# Patient Record
Sex: Male | Born: 1980 | Race: Asian | Hispanic: No | Marital: Married | Smoking: Former smoker
Health system: Southern US, Community
[De-identification: ages and names within clinical notes are randomized; demographics above are authoritative.]

## PROBLEM LIST (undated history)

## (undated) DIAGNOSIS — Z8249 Family history of ischemic heart disease and other diseases of the circulatory system: Secondary | ICD-10-CM

## (undated) DIAGNOSIS — R002 Palpitations: Secondary | ICD-10-CM

## (undated) DIAGNOSIS — I10 Essential (primary) hypertension: Secondary | ICD-10-CM

## (undated) DIAGNOSIS — K2950 Unspecified chronic gastritis without bleeding: Secondary | ICD-10-CM

## (undated) DIAGNOSIS — F419 Anxiety disorder, unspecified: Secondary | ICD-10-CM

## (undated) HISTORY — DX: Essential (primary) hypertension: I10

## (undated) HISTORY — DX: Unspecified chronic gastritis without bleeding: K29.50

## (undated) HISTORY — DX: Family history of ischemic heart disease and other diseases of the circulatory system: Z82.49

## (undated) HISTORY — DX: Palpitations: R00.2

---

## 2013-05-06 ENCOUNTER — Encounter (HOSPITAL_COMMUNITY): Payer: Self-pay | Admitting: Emergency Medicine

## 2013-05-06 ENCOUNTER — Emergency Department (HOSPITAL_COMMUNITY): Payer: BC Managed Care – PPO

## 2013-05-06 ENCOUNTER — Emergency Department (HOSPITAL_COMMUNITY)
Admission: EM | Admit: 2013-05-06 | Discharge: 2013-05-07 | Disposition: A | Discharge status: Home / Self Care | Payer: BC Managed Care – PPO | Attending: Emergency Medicine | Admitting: Emergency Medicine

## 2013-05-06 DIAGNOSIS — R9431 Abnormal electrocardiogram [ECG] [EKG]: Secondary | ICD-10-CM | POA: Insufficient documentation

## 2013-05-06 DIAGNOSIS — J3489 Other specified disorders of nose and nasal sinuses: Secondary | ICD-10-CM | POA: Diagnosis not present

## 2013-05-06 DIAGNOSIS — F411 Generalized anxiety disorder: Secondary | ICD-10-CM | POA: Diagnosis not present

## 2013-05-06 DIAGNOSIS — Z79899 Other long term (current) drug therapy: Secondary | ICD-10-CM | POA: Diagnosis not present

## 2013-05-06 DIAGNOSIS — R079 Chest pain, unspecified: Secondary | ICD-10-CM | POA: Insufficient documentation

## 2013-05-06 DIAGNOSIS — F17200 Nicotine dependence, unspecified, uncomplicated: Secondary | ICD-10-CM | POA: Insufficient documentation

## 2013-05-06 DIAGNOSIS — G479 Sleep disorder, unspecified: Secondary | ICD-10-CM | POA: Diagnosis not present

## 2013-05-06 HISTORY — DX: Anxiety disorder, unspecified: F41.9

## 2013-05-06 LAB — BASIC METABOLIC PANEL
BUN: 10 mg/dL (ref 6–23)
Creatinine, Ser: 0.86 mg/dL (ref 0.50–1.35)
GFR calc Af Amer: >90 mL/min (ref >90)
GFR calc non Af Amer: >90 mL/min (ref >90)
Potassium: 3.7 mEq/L (ref 3.5–5.1)

## 2013-05-06 LAB — PRO B NATRIURETIC PEPTIDE: Pro B Natriuretic peptide (BNP): 9.7 pg/mL (ref 0–125)

## 2013-05-06 LAB — POCT I-STAT TROPONIN I

## 2013-05-06 LAB — CBC
HCT: 45.7 % (ref 39.0–52.0)
MCHC: 33.9 g/dL (ref 30.0–36.0)
MCV: 84.8 fL (ref 78.0–100.0)
Platelets: 252 10*3/uL (ref 150–400)
RBC: 5.39 MIL/uL (ref 4.22–5.81)
RDW: 12.4 % (ref 11.5–15.5)
WBC: 8.6 10*3/uL (ref 4.0–10.5)

## 2013-05-06 MED ORDER — LORAZEPAM 1 MG PO TABS
1.0000 mg | ORAL_TABLET | Freq: Once | ORAL | Status: AC
  Administered 2013-05-06: 1 mg via ORAL
  Filled 2013-05-06: qty 1

## 2013-05-06 MED ORDER — ASPIRIN 81 MG PO CHEW
324.0000 mg | CHEWABLE_TABLET | Freq: Once | ORAL | Status: AC
  Administered 2013-05-06: 324 mg via ORAL
  Filled 2013-05-06: qty 4

## 2013-05-06 MED ORDER — OXYMETAZOLINE HCL 0.05 % NA SOLN
1.0000 | Freq: Once | NASAL | Status: AC
  Administered 2013-05-06: 1 via NASAL
  Filled 2013-05-06: qty 30

## 2013-05-06 NOTE — ED Notes (Signed)
Pt refused peripheral IV at this time.

## 2013-05-06 NOTE — ED Notes (Signed)
Pt. reports intermittent mid /left chest pain for 3 days with slight SOB , denies nausea or diaphoresis , no chest pain at triage . Pt. states history of anxiety attack . No history of heart disease.

## 2013-05-06 NOTE — ED Provider Notes (Signed)
CSN: [id number]     Arrival date & time 05/06/13  1937 History   First MD Initiated Contact with Patient 05/06/13 2318     Chief Complaint  Patient presents with  . Chest Pain   (Consider location/radiation/quality/duration/timing/severity/associated sxs/prior Treatment) HPI Hx per PT - CP L sided feels like pins and needles, lasts minutes, comes and goes, has h/o anxiety.  He has had poor sleep last few days, also congested. No fevers, no facial pain or swelling.  He has had a stress test when he lived in [place] - reported normal. He has been working out more frequently at the gym last few weeks.  No recent travel. He is a smoker.  Past Medical History  Diagnosis Date  . Anxiety    History reviewed. No pertinent past surgical history. History reviewed. No pertinent family history. History  Substance Use Topics  . Smoking status: Never Smoker   . Smokeless tobacco: Not on file  . Alcohol Use: Yes    Review of Systems  Constitutional: Negative for fever and chills.  Eyes: Negative for visual disturbance.  Respiratory: Negative for shortness of breath.   Cardiovascular: Positive for chest pain.  Gastrointestinal: Negative for nausea, vomiting and abdominal pain.  Genitourinary: Negative for dysuria.  Musculoskeletal: Negative for back pain, neck pain and neck stiffness.  Skin: Negative for rash.  Neurological: Negative for headaches.  All other systems reviewed and are negative.    Allergies  Review of patient's allergies indicates no known allergies.  Home Medications   Current Outpatient Rx  Name  Route  Sig  Dispense  Refill  . ALPRAZolam (XANAX) 0.25 MG tablet   Oral   Take 0.25 mg by mouth 2 (two) times daily as needed for anxiety.         . pantoprazole (PROTONIX) 40 MG tablet   Oral   Take 40 mg by mouth daily.         Marland Kitchen PARoxetine (PAXIL-CR) 12.5 MG 24 hr tablet   Oral   Take 12.5 mg by mouth 2 (two) times daily.         Marland Kitchen PRESCRIPTION MEDICATION  Oral   Take 1 tablet by mouth every Sunday. *rx from another country, 60000 units of chewable vitamin d once a week*         . PRESCRIPTION MEDICATION   Oral   Take 6.25 mg by mouth at bedtime. *rx from another country, nexipride(levosulpiride) 25mg *         . propranolol (INDERAL) 10 MG tablet   Oral   Take 10 mg by mouth at bedtime.          BP 144/84  Pulse 84  Temp(Src) 98.7 F (37.1 C) (Oral)  Resp 18  Wt 187 lb 6.3 oz (85 kg)  SpO2 99% Physical Exam  Constitutional: He is oriented to person, place, and time. He appears well-developed and well-nourished.  HENT:  Head: Normocephalic and atraumatic.  Eyes: Conjunctivae and EOM are normal. Pupils are equal, round, and reactive to light.  Neck: Trachea normal. Neck supple. No thyromegaly present.  Cardiovascular: Normal rate, regular rhythm, S1 normal, S2 normal and normal pulses.     No systolic murmur is present   No diastolic murmur is present  Pulses:      Radial pulses are 2+ on the right side, and 2+ on the left side.  Pulmonary/Chest: Effort normal and breath sounds normal. He has no wheezes. He has no rhonchi. He has no rales. He  exhibits no tenderness.  Abdominal: Soft. Normal appearance and bowel sounds are normal. There is no tenderness. There is no CVA tenderness and negative Murphy's sign.  Musculoskeletal:  Calves nontender, no cords or erythema, negative Homans sign  Neurological: He is alert and oriented to person, place, and time. He has normal strength. No cranial nerve deficit or sensory deficit. GCS eye subscore is 4. GCS verbal subscore is 5. GCS motor subscore is 6.  Skin: Skin is warm and dry. No rash noted. He is not diaphoretic.  Psychiatric: His speech is normal.  Anxious, otherwise, cooperative and appropriate    ED Course  Procedures (including critical care time) Labs Review Labs Reviewed  CBC  BASIC METABOLIC PANEL  PRO B NATRIURETIC PEPTIDE  POCT I-STAT TROPONIN I   Imaging  Review Dg Chest 2 View  05/06/2013   CLINICAL DATA:  Left chest pain.  History of tobacco use.  EXAM: CHEST - 2 VIEW  COMPARISON:  None  FINDINGS: The heart size and mediastinal contours are within normal limits. There is no evidence of pulmonary edema, consolidation, pneumothorax, nodule or pleural fluid. The visualized skeletal structures are unremarkable.  IMPRESSION: No active disease.   Electronically Signed   By: Irish Lack M.D.   On: 05/06/2013 20:18    EKG Interpretation    Date/Time:  Monday May 06 2013 19:46:02 EST Ventricular Rate:  78 PR Interval:  132 QRS Duration: 92 QT Interval:  380 QTC Calculation: 433 R Axis:   43 Text Interpretation:  Normal sinus rhythm Nonspecific ST abnormality Abnormal ECG Confirmed by Denman Pichardo  MD, Ludwika Rodd [phone]) on 05/06/2013 11:23:48 PM           ASA. Ativan.   Neg serial troponins, no active CP  Plan d/c home, return precautions and outpatient referal. Reliable historian states understanding d/c and f/u instructions.  MDM  Dx: CP, history of anxiety, low risk for ACS  ECG, labs, CXR Medications provided VS and nurses notes reviewed    Sunnie Nielsen, MD 05/07/13 [phone]

## 2013-06-17 ENCOUNTER — Ambulatory Visit (INDEPENDENT_AMBULATORY_CARE_PROVIDER_SITE_OTHER): Payer: BC Managed Care – PPO | Admitting: Cardiology

## 2013-06-17 ENCOUNTER — Encounter: Payer: Self-pay | Admitting: Cardiology

## 2013-06-17 VITALS — BP 116/80 | HR 68 | Ht 69.0 in | Wt 192.8 lb

## 2013-06-17 DIAGNOSIS — Z8249 Family history of ischemic heart disease and other diseases of the circulatory system: Secondary | ICD-10-CM

## 2013-06-17 DIAGNOSIS — I10 Essential (primary) hypertension: Secondary | ICD-10-CM

## 2013-06-17 DIAGNOSIS — R002 Palpitations: Secondary | ICD-10-CM

## 2013-06-17 DIAGNOSIS — R079 Chest pain, unspecified: Secondary | ICD-10-CM

## 2013-06-17 HISTORY — DX: Family history of ischemic heart disease and other diseases of the circulatory system: Z82.49

## 2013-06-17 HISTORY — DX: Palpitations: R00.2

## 2013-06-17 LAB — LIPID PANEL
Cholesterol: 236 mg/dL — ABNORMAL HIGH (ref 0–200)
HDL: 28.9 mg/dL — ABNORMAL LOW (ref >39.00)
Total CHOL/HDL Ratio: 8
Triglycerides: 188 mg/dL — ABNORMAL HIGH (ref 0.0–149.0)
VLDL: 37.6 mg/dL (ref 0.0–40.0)

## 2013-06-17 LAB — COMPREHENSIVE METABOLIC PANEL
ALT: 41 U/L (ref 0–53)
AST: 28 U/L (ref 0–37)
Albumin: 4.5 g/dL (ref 3.5–5.2)
Alkaline Phosphatase: 55 U/L (ref 39–117)
BUN: 11 mg/dL (ref 6–23)
CO2: 26 mEq/L (ref 19–32)
Calcium: 9.3 mg/dL (ref 8.4–10.5)
Chloride: 103 mEq/L (ref 96–112)
Creatinine, Ser: 0.9 mg/dL (ref 0.4–1.5)
GFR: 102.83 mL/min (ref >60.00)
Glucose, Bld: 104 mg/dL — ABNORMAL HIGH (ref 70–99)
Potassium: 3.7 mEq/L (ref 3.5–5.1)
Sodium: 135 mEq/L (ref 135–145)
Total Bilirubin: 0.9 mg/dL (ref 0.3–1.2)
Total Protein: 7.5 g/dL (ref 6.0–8.3)

## 2013-06-17 LAB — LDL CHOLESTEROL, DIRECT: Direct LDL: 178.8 mg/dL

## 2013-06-17 LAB — TSH: TSH: 1.66 u[IU]/mL (ref 0.35–5.50)

## 2013-06-17 NOTE — Patient Instructions (Addendum)
Your physician recommends that you continue on your current medications as directed. Please refer to the Current Medication list given to you today.  Labs Today: Cmet, Lipid, Tsh  Your physician has requested that you have an exercise tolerance test. For further information please visit [email or web address]. Please also follow instruction sheet, as given.  Your physician recommends that you schedule a follow-up appointment in: after gxt

## 2013-06-17 NOTE — Progress Notes (Signed)
Patient ID: Clarence HarpinYogesh S Armstrong, male   DOB: 19-Dec-1980, 33 y.o.   MRN: [id number]     Patient Name: Clarence HarpinYogesh S Armstrong Date of Encounter: 06/17/2013  Primary Care Provider:  No primary provider on file. Primary Cardiologist:  Tobias AlexanderNELSON, Calah Gershman, H   Problem List   Past Medical History  Diagnosis Date  . Anxiety    No past surgical history on file.  Allergies  No Known Allergies  HPI  33 year old male with h/o palpitations, hyperventilation followed by chest pain with radiation to the left arm.  CP L sided feels like pins and needles, lasts minutes, comes and goes. He was seen by a psychiatrist and is being treated for anxiety.  He has had a stress test when he lived in [place] - reported normal. He has been working out more frequently at the gym without chest pain but complains of mild dizziness. He has been smoking since his age 33. His father underwent PCI/stent at age 32. He has been treated for hypertension for the last 5 years. Also h/o antral gastritis after episodic etoh drinking.  Home Medications  Prior to Admission medications   Medication Sig Start Date End Date Taking? Authorizing Provider  ALPRAZolam (XANAX) 0.25 MG tablet Take 0.25 mg by mouth 2 (two) times daily as needed for anxiety.    Historical Provider, MD  pantoprazole (PROTONIX) 40 MG tablet Take 40 mg by mouth daily.    Historical Provider, MD  PARoxetine (PAXIL-CR) 12.5 MG 24 hr tablet Take 12.5 mg by mouth 2 (two) times daily.    Historical Provider, MD  PRESCRIPTION MEDICATION Take 1 tablet by mouth every Sunday. *rx from another country, 60000 units of chewable vitamin d once a week*    Historical Provider, MD  PRESCRIPTION MEDICATION Take 6.25 mg by mouth at bedtime. *rx from another country, nexipride(levosulpiride) 25mg *    Historical Provider, MD  propranolol (INDERAL) 10 MG tablet Take 10 mg by mouth at bedtime.    Historical Provider, MD    Family History  No family history on file.  Social  History  History   Social History  . Marital Status: Married    Spouse Name: N/A    Number of Children: N/A  . Years of Education: N/A   Occupational History  . Not on file.   Social History Main Topics  . Smoking status: Never Smoker   . Smokeless tobacco: Not on file  . Alcohol Use: Yes  . Drug Use: No  . Sexual Activity: Not on file   Other Topics Concern  . Not on file   Social History Narrative  . No narrative on file     Review of Systems, as per HPI, otherwise negative General:  No chills, fever, night sweats or weight changes.  Cardiovascular:  No chest pain, dyspnea on exertion, edema, orthopnea, palpitations, paroxysmal nocturnal dyspnea. Dermatological: No rash, lesions/masses Respiratory: No cough, dyspnea Urologic: No hematuria, dysuria Abdominal:   No nausea, vomiting, diarrhea, bright red blood per rectum, melena, or hematemesis Neurologic:  No visual changes, wkns, changes in mental status. All other systems reviewed and are otherwise negative except as noted above.  Physical Exam  There were no vitals taken for this visit.  General: Pleasant, NAD Psych: Normal affect. Neuro: Alert and oriented X 3. Moves all extremities spontaneously. HEENT: Normal  Neck: Supple without bruits or JVD. Lungs:  Resp regular and unlabored, CTA. Heart: RRR no s3, s4, or murmurs. Abdomen: Soft, non-tender, non-distended, BS +  x 4.  Extremities: No clubbing, cyanosis or edema. DP/PT/Radials 2+ and equal bilaterally.  Labs:  No results found for this basename: CKTOTAL, CKMB, TROPONINI,  in the last 72 hours Lab Results  Component Value Date   WBC 8.6 05/06/2013   HGB 15.5 05/06/2013   HCT 45.7 05/06/2013   MCV 84.8 05/06/2013   PLT 252 05/06/2013   No results found for this basename: NA, K, CL, CO2, BUN, CREATININE, CALCIUM, LABALBU, PROT, BILITOT, ALKPHOS, ALT, AST, GLUCOSE,  in the last 168 hours No results found for this basename: CHOL, HDL, LDLCALC, TRIG    No results found for this basename: DDIMER   No components found with this basename: POCBNP,   Accessory Clinical Findings  echocardiogram  ECG - NSR, normal ECG   Assessment & Plan  33 year old with very atypical chest pain, most probably anxiety that he is being treated for. Normal ECG.  However, 20 years of smoking, h/o hypertension and family h/o CAD. We will order an exercise treadmill stress test, lipid profile, TSH and CMP. His BP is controlled today, we will continue propranolol.  Follow up in 3 weeks.   Lars Masson, MD, [facility] 06/17/2013, 9:34 AM

## 2013-06-19 ENCOUNTER — Ambulatory Visit (HOSPITAL_COMMUNITY)
Admission: RE | Admit: 2013-06-19 | Discharge: 2013-06-19 | Disposition: A | Discharge status: Home / Self Care | Payer: BC Managed Care – PPO | Source: Ambulatory Visit | Attending: Cardiology | Admitting: Cardiology

## 2013-06-19 DIAGNOSIS — R002 Palpitations: Secondary | ICD-10-CM

## 2013-06-19 DIAGNOSIS — R079 Chest pain, unspecified: Secondary | ICD-10-CM

## 2013-06-21 ENCOUNTER — Other Ambulatory Visit: Payer: Self-pay | Admitting: *Deleted

## 2013-06-21 MED ORDER — ATORVASTATIN CALCIUM 10 MG PO TABS
10.0000 mg | ORAL_TABLET | Freq: Every day | ORAL | Status: DC
Start: 2013-06-21 — End: 2013-08-02

## 2013-07-08 ENCOUNTER — Encounter: Payer: Self-pay | Admitting: *Deleted

## 2013-07-15 ENCOUNTER — Ambulatory Visit: Payer: BC Managed Care – PPO | Admitting: Cardiology

## 2013-07-26 ENCOUNTER — Encounter: Payer: Self-pay | Admitting: Cardiology

## 2013-07-26 ENCOUNTER — Ambulatory Visit (INDEPENDENT_AMBULATORY_CARE_PROVIDER_SITE_OTHER): Payer: BC Managed Care – PPO | Admitting: Cardiology

## 2013-07-26 VITALS — BP 131/87 | HR 70 | Ht 69.0 in | Wt 194.0 lb

## 2013-07-26 DIAGNOSIS — R002 Palpitations: Secondary | ICD-10-CM

## 2013-07-26 DIAGNOSIS — I10 Essential (primary) hypertension: Secondary | ICD-10-CM

## 2013-07-26 DIAGNOSIS — R079 Chest pain, unspecified: Secondary | ICD-10-CM

## 2013-07-26 NOTE — Patient Instructions (Addendum)
Your physician recommends that you have lab work TODAY: Lipoprotein A and NMR LipoProfile  Your Physician will contact you if needed to follow-up  Your Physician recommends that you continue on your current medications as directed. Please refer to the Current Medication list given to you today.

## 2013-07-26 NOTE — Progress Notes (Signed)
Patient ID: Clarence Armstrong, male   DOB: 30-Jun-1980, 33 y.o.   MRN: [id number]    Patient Name: Clarence Armstrong Date of Encounter: 07/26/2013  Primary Care Provider:  No primary provider on file. Primary Cardiologist:  Tobias AlexanderNELSON, Earlee Herald, H  Problem List   Past Medical History  Diagnosis Date  . Anxiety   . Antral gastritis     history of  . Unspecified essential hypertension   . Family history of coronary arteriosclerosis 06/17/2013  . Palpitations 06/17/2013   No past surgical history on file.  Allergies  No Known Allergies  HPI  33 year old male with h/o palpitations, hyperventilation followed by chest pain with radiation to the left arm.  CP L sided feels like pins and needles, lasts minutes, comes and goes. He was seen by a psychiatrist and is being treated for anxiety.  He has had a stress test when he lived in [place] - reported normal. He has been working out more frequently at the gym without chest pain but complains of mild dizziness. He has been smoking since his age 33. His father underwent PCI/stent at age 756. He has been treated for hypertension for the last 5 years. Also h/o antral gastritis after episodic etoh drinking.  The patient is coming for followup in one month's he states that he actually feels better. He started to workout in a gym and doesn't have any shortness of breath or chest pain.  Home Medications  Prior to Admission medications   Medication Sig Start Date End Date Taking? Authorizing Provider  ALPRAZolam (XANAX) 0.25 MG tablet Take 0.25 mg by mouth 2 (two) times daily as needed for anxiety.    Historical Provider, MD  pantoprazole (PROTONIX) 40 MG tablet Take 40 mg by mouth daily.    Historical Provider, MD  PARoxetine (PAXIL-CR) 12.5 MG 24 hr tablet Take 12.5 mg by mouth 2 (two) times daily.    Historical Provider, MD  PRESCRIPTION MEDICATION Take 1 tablet by mouth every Sunday. *rx from another country, 60000 units of chewable vitamin d once a week*     Historical Provider, MD  PRESCRIPTION MEDICATION Take 6.25 mg by mouth at bedtime. *rx from another country, nexipride(levosulpiride) 25mg *    Historical Provider, MD  propranolol (INDERAL) 10 MG tablet Take 10 mg by mouth at bedtime.    Historical Provider, MD    Family History  Family History  Problem Relation Age of Onset  . Heart Problems Father 2856    PCI-stent     Social History  History   Social History  . Marital Status: Married    Spouse Name: N/A    Number of Children: N/A  . Years of Education: N/A   Occupational History  . Not on file.   Social History Main Topics  . Smoking status: Current Every Day Smoker -- 11 years    Types: Cigarettes  . Smokeless tobacco: Not on file  . Alcohol Use: Yes  . Drug Use: No  . Sexual Activity: Not on file   Other Topics Concern  . Not on file   Social History Narrative  . No narrative on file     Review of Systems, as per HPI, otherwise negative General:  No chills, fever, night sweats or weight changes.  Cardiovascular:  No chest pain, dyspnea on exertion, edema, orthopnea, palpitations, paroxysmal nocturnal dyspnea. Dermatological: No rash, lesions/masses Respiratory: No cough, dyspnea Urologic: No hematuria, dysuria Abdominal:   No nausea, vomiting, diarrhea, bright red blood  per rectum, melena, or hematemesis Neurologic:  No visual changes, wkns, changes in mental status. All other systems reviewed and are otherwise negative except as noted above.  Physical Exam  Blood pressure 131/87, pulse 70, height 5\' 9"  (1.753 m), weight 194 lb (87.998 kg).  General: Pleasant, NAD Psych: Normal affect. Neuro: Alert and oriented X 3. Moves all extremities spontaneously. HEENT: Normal  Neck: Supple without bruits or JVD. Lungs:  Resp regular and unlabored, CTA. Heart: RRR no s3, s4, or murmurs. Abdomen: Soft, non-tender, non-distended, BS + x 4.  Extremities: No clubbing, cyanosis or edema. DP/PT/Radials 2+ and equal  bilaterally.  Labs:  No results found for this basename: CKTOTAL, CKMB, TROPONINI,  in the last 72 hours Lab Results  Component Value Date   WBC 8.6 05/06/2013   HGB 15.5 05/06/2013   HCT 45.7 05/06/2013   MCV 84.8 05/06/2013   PLT 252 05/06/2013   No results found for this basename: NA, K, CL, CO2, BUN, CREATININE, CALCIUM, LABALBU, PROT, BILITOT, ALKPHOS, ALT, AST, GLUCOSE,  in the last 168 hours Lab Results  Component Value Date   CHOL 236* 06/17/2013   Accessory Clinical Findings  Echocardiogram - none  ECG - NSR, normal ECG   Assessment & Plan  33 year old   1. Atypical chest pain, most probably anxiety that he is being treated for. Normal ECG.  However, 20 years of smoking, h/o hypertension and family h/o CAD. An exercise treadmill stress test showed good exercise capacity and no ischemia.  2. Hyperlipidemia - the patient has elevated triglycerides and LDL and low HDL. He already saw the results and started to exercise on a daily basis and dramatically changed his diet. We will order lipoprotein a and NMR lipid profile and call the patient with the results. Patient prefers not to be treated with statins yet, we will repeat lipid profile after 2 months of his Lifestyle changes.   Lars Masson, MD, [facility] 07/26/2013, 2:33 PM

## 2013-07-27 LAB — LIPOPROTEIN A (LPA): Lipoprotein (a): 18 mg/dL (ref 0–30)

## 2013-07-29 LAB — NMR LIPOPROFILE WITH LIPIDS
Cholesterol, Total: 222 mg/dL — ABNORMAL HIGH (ref <200)
HDL Particle Number: 25 umol/L — ABNORMAL LOW (ref >=30.5)
HDL Size: 8.5 nm — ABNORMAL LOW (ref >=9.2)
HDL-C: 32 mg/dL — ABNORMAL LOW (ref >=40)
LDL (calc): 152 mg/dL — ABNORMAL HIGH (ref <100)
LDL Particle Number: 2289 nmol/L — ABNORMAL HIGH (ref <1000)
LDL Size: 20.2 nm — ABNORMAL LOW (ref >20.5)
LP-IR Score: 81 — ABNORMAL HIGH (ref <=45)
Large HDL-P: <1.3 umol/L — ABNORMAL LOW (ref >=4.8)
Large VLDL-P: 6.7 nmol/L — ABNORMAL HIGH (ref <=2.7)
Small LDL Particle Number: 1373 nmol/L — ABNORMAL HIGH (ref <=527)
Triglycerides: 188 mg/dL — ABNORMAL HIGH (ref <150)
VLDL Size: 52.8 nm — ABNORMAL HIGH (ref <=46.6)

## 2013-07-31 ENCOUNTER — Encounter: Payer: Self-pay | Admitting: Cardiology

## 2013-08-02 ENCOUNTER — Other Ambulatory Visit: Payer: Self-pay

## 2013-08-02 DIAGNOSIS — E785 Hyperlipidemia, unspecified: Secondary | ICD-10-CM

## 2013-08-02 MED ORDER — PANTOPRAZOLE SODIUM 40 MG PO TBEC: 40.0000 mg | DELAYED_RELEASE_TABLET | Freq: Every day | ORAL | Status: DC

## 2013-08-02 MED ORDER — ATORVASTATIN CALCIUM 40 MG PO TABS: 40.0000 mg | ORAL_TABLET | Freq: Every day | ORAL | Status: DC

## 2013-08-28 ENCOUNTER — Encounter: Payer: Self-pay | Admitting: Cardiology

## 2013-09-06 ENCOUNTER — Encounter: Payer: Self-pay | Admitting: Cardiology

## 2013-09-16 ENCOUNTER — Encounter (HOSPITAL_COMMUNITY): Payer: Self-pay | Admitting: Emergency Medicine

## 2013-09-16 ENCOUNTER — Emergency Department (HOSPITAL_COMMUNITY)
Admission: EM | Admit: 2013-09-16 | Discharge: 2013-09-16 | Disposition: A | Discharge status: Home / Self Care | Payer: BC Managed Care – PPO | Source: Home / Self Care

## 2013-09-16 DIAGNOSIS — IMO0002 Reserved for concepts with insufficient information to code with codable children: Secondary | ICD-10-CM

## 2013-09-16 DIAGNOSIS — M545 Low back pain, unspecified: Secondary | ICD-10-CM

## 2013-09-16 DIAGNOSIS — X58XXXA Exposure to other specified factors, initial encounter: Secondary | ICD-10-CM

## 2013-09-16 DIAGNOSIS — F450 Somatization disorder: Secondary | ICD-10-CM

## 2013-09-16 DIAGNOSIS — F411 Generalized anxiety disorder: Secondary | ICD-10-CM

## 2013-09-16 DIAGNOSIS — S46919A Strain of unspecified muscle, fascia and tendon at shoulder and upper arm level, unspecified arm, initial encounter: Secondary | ICD-10-CM

## 2013-09-16 NOTE — ED Notes (Signed)
C/o back pain and medication for cholesterol States lower back pain radiates to left side of neck Denies any injury States he does have a High LDL and would like medication States statins was making him feel bad

## 2013-09-16 NOTE — ED Provider Notes (Signed)
CSN: [id number]     Arrival date & time 09/16/13  1052 History   First MD Initiated Contact with Patient 09/16/13 1242     Chief Complaint  Patient presents with  . Back Pain  . Hyperlipidemia   (Consider location/radiation/quality/duration/timing/severity/associated sxs/prior Treatment) HPI Comments: 33 year old [place] male presents with a complaint of left lower back discomfort and left upper back/shoulder discomfort. The discomfort is elicited or exacerbated by twisting of the torso in certain movements of the arm and shoulder. Denies any known injury. He had been exercising awakes but not in recent weeks. Patient has history of generalized anxiety disorder and somatization. Recently he was seen by cardiologist and a normal exercise stress test. He voices several minor complaints that seem unrelated. Last night when he was experiencing left low back pain he became frightened that it was his heart and was having a heart attack. He was shaking and shivering with fear. He states that he has been having chest pains intermittently for several months. He is also concerned about a small bump in the wrist which is either a carpal bone or small ganglion. His second concern is that of rather excessive worry over elevated cholesterol levels. This was discussed with his cardiologist and a recent visit at the patient declined statins due to a previous adverse effect of myalgias. He is concerned that his cholesterol is elevated and if it is not reduced soon  he will have a heart attack.   Past Medical History  Diagnosis Date  . Anxiety   . Antral gastritis     history of  . Unspecified essential hypertension   . Family history of coronary arteriosclerosis 06/17/2013  . Palpitations 06/17/2013   History reviewed. No pertinent past surgical history. Family History  Problem Relation Age of Onset  . Heart Problems Father [id number]    PCI-stent    History  Substance Use Topics  . Smoking status: Current Every Day  Smoker -- 11 years    Types: Cigarettes  . Smokeless tobacco: Not on file  . Alcohol Use: Yes    Review of Systems  Constitutional: Negative for fever, activity change and fatigue.  HENT: Negative.   Respiratory: Negative for cough and stridor.   Cardiovascular: Positive for chest pain. Negative for leg swelling.       Not today.  Gastrointestinal: Negative.   Genitourinary: Negative.   Musculoskeletal: Positive for back pain. Negative for joint swelling, neck pain and neck stiffness.  Skin: Negative.   Neurological: Positive for tremors, light-headedness and numbness. Negative for dizziness, seizures, syncope, facial asymmetry, speech difficulty, weakness and headaches.  Psychiatric/Behavioral: Positive for sleep disturbance and agitation. Negative for suicidal ideas, confusion, self-injury, dysphoric mood and decreased concentration. The patient is nervous/anxious.     Allergies  Review of patient's allergies indicates no known allergies.  Home Medications   Current Outpatient Rx  Name  Route  Sig  Dispense  Refill  . ALPRAZolam (XANAX) 0.25 MG tablet   Oral   Take 0.25 mg by mouth 2 (two) times daily as needed for anxiety.         Marland Kitchen. atorvastatin (LIPITOR) 40 MG tablet   Oral   Take 1 tablet (40 mg total) by mouth at bedtime.   30 tablet   6     Increase in dose   . pantoprazole (PROTONIX) 40 MG tablet   Oral   Take 1 tablet (40 mg total) by mouth daily.   30 tablet   6  Increase in dose   . PARoxetine (PAXIL-CR) 12.5 MG 24 hr tablet   Oral   Take 12.5 mg by mouth 2 (two) times daily.         Marland Kitchen PRESCRIPTION MEDICATION   Oral   Take 1 tablet by mouth every Sunday. *rx from another country, 60000 units of chewable vitamin d once a week*         . PRESCRIPTION MEDICATION   Oral   Take 6.25 mg by mouth at bedtime. *rx from another country, nexipride(levosulpiride) 25mg *         . propranolol (INDERAL) 10 MG tablet   Oral   Take 10 mg by mouth at  bedtime.          BP 150/87  Pulse 74  Temp(Src) 98.2 F (36.8 C) (Oral)  Resp 14  SpO2 99% Physical Exam  Nursing note and vitals reviewed. Constitutional: He appears well-developed and well-nourished. No distress.  HENT:  Head: Normocephalic.  Eyes: Conjunctivae and EOM are normal. Pupils are equal, round, and reactive to light.  Neck: Normal range of motion. Neck supple.  Cardiovascular: Normal rate, regular rhythm and normal heart sounds.   Pulmonary/Chest: Effort normal and breath sounds normal. No respiratory distress. He has no wheezes. He has no rales. He exhibits no tenderness.  Musculoskeletal: Normal range of motion. He exhibits tenderness. He exhibits no edema.  No tenderness to the left paralumbar muscles however it is reproduced with the patient rotation of his torso. There is mild tenderness to the peri-scapular muscles and infrascapular muscle. This is exacerbated by posterior abduction of the shoulder as well as placing his left arm behind his back. There is no swelling, deformity or discoloration. Distal neurovascular motor sensory is intact. Full range of motion of the left shoulder and arm.  Lymphadenopathy:    He has no cervical adenopathy.  Neurological: He is alert. He exhibits normal muscle tone.  Skin: Skin is warm and dry. No rash noted. No erythema.  Psychiatric: His speech is normal. His mood appears anxious. His affect is not angry and not blunt. He is not agitated, not slowed, not withdrawn and not combative. Cognition and memory are normal. He does not exhibit a depressed mood.    ED Course  Procedures (including critical care time) Labs Review Labs Reviewed - No data to display Imaging Review No results found.   MDM   1. Left low back pain   2. Muscle strain of scapular region   3. GAD (generalized anxiety disorder)   4. Somatization disorder     Heat and stretches to shoulder and back NSAIDS Reassurance. Cont with walking daily, and  eating a low fat/cholesterol diet. F/u with cards for lipid management     Hayden Rasmussen, NP 09/16/13 1339

## 2013-09-16 NOTE — ED Provider Notes (Signed)
Medical screening examination/treatment/procedure(s) were performed by non-physician practitioner and as supervising physician I was immediately available for consultation/collaboration.  Basil Buffin, M.D.  Adan Beal C Cray Monnin, MD 09/16/13 1611 

## 2013-09-16 NOTE — Discharge Instructions (Signed)
Back Pain, Adult Low back pain is very common. About 1 in 5 people have back pain.The cause of low back pain is rarely dangerous. The pain often gets better over time.About half of people with a sudden onset of back pain feel better in just 2 weeks. About 8 in 10 people feel better by 6 weeks.  CAUSES Some common causes of back pain include:  Strain of the muscles or ligaments supporting the spine.  Wear and tear (degeneration) of the spinal discs.  Arthritis.  Direct injury to the back. DIAGNOSIS Most of the time, the direct cause of low back pain is not known.However, back pain can be treated effectively even when the exact cause of the pain is unknown.Answering your caregiver's questions about your overall health and symptoms is one of the most accurate ways to make sure the cause of your pain is not dangerous. If your caregiver needs more information, he or she may order lab work or imaging tests (X-rays or MRIs).However, even if imaging tests show changes in your back, this usually does not require surgery. HOME CARE INSTRUCTIONS For many people, back pain returns.Since low back pain is rarely dangerous, it is often a condition that people can learn to [facility] their own.   Remain active. It is stressful on the back to sit or stand in one place. Do not sit, drive, or stand in one place for more than 30 minutes at a time. Take short walks on level surfaces as soon as pain allows.Try to increase the length of time you walk each day.  Do not stay in bed.Resting more than 1 or 2 days can delay your recovery.  Do not avoid exercise or work.Your body is made to move.It is not dangerous to be active, even though your back may hurt.Your back will likely heal faster if you return to being active before your pain is gone.  Pay attention to your body when you bend and lift. Many people have less discomfortwhen lifting if they bend their knees, keep the load close to their bodies,and  avoid twisting. Often, the most comfortable positions are those that put less stress on your recovering back.  Find a comfortable position to sleep. Use a firm mattress and lie on your side with your knees slightly bent. If you lie on your back, put a pillow under your knees.  Only take over-the-counter or prescription medicines as directed by your caregiver. Over-the-counter medicines to reduce pain and inflammation are often the most helpful.Your caregiver may prescribe muscle relaxant drugs.These medicines help dull your pain so you can more quickly return to your normal activities and healthy exercise.  Put ice on the injured area.  Put ice in a plastic bag.  Place a towel between your skin and the bag.  Leave the ice on for 15-20 minutes, 03-04 times a day for the first 2 to 3 days. After that, ice and heat may be alternated to reduce pain and spasms.  Ask your caregiver about trying back exercises and gentle massage. This may be of some benefit.  Avoid feeling anxious or stressed.Stress increases muscle tension and can worsen back pain.It is important to recognize when you are anxious or stressed and learn ways to manage it.Exercise is a great option. SEEK MEDICAL CARE IF:  You have pain that is not relieved with rest or medicine.  You have pain that does not improve in 1 week.  You have new symptoms.  You are generally not feeling well. SEEK  IMMEDIATE MEDICAL CARE IF:   You have pain that radiates from your back into your legs.  You develop new bowel or bladder control problems.  You have unusual weakness or numbness in your arms or legs.  You develop nausea or vomiting.  You develop abdominal pain.  You feel faint. Document Released: 05/30/2005 Document Revised: 11/29/2011 Document Reviewed: 10/18/2010 [facility] Patient Information 2014 [place], [place].  Muscle Strain A muscle strain is an injury that occurs when a muscle is stretched beyond its normal length.  Usually a small number of muscle fibers are torn when this happens. Muscle strain is rated in degrees. First-degree strains have the least amount of muscle fiber tearing and pain. Second-degree and third-degree strains have increasingly more tearing and pain.  Usually, recovery from muscle strain takes 1 2 weeks. Complete healing takes 5 6 weeks.  CAUSES  Muscle strain happens when a sudden, violent force placed on a muscle stretches it too far. This may occur with lifting, sports, or a fall.  RISK FACTORS Muscle strain is especially common in athletes.  SIGNS AND SYMPTOMS At the site of the muscle strain, there may be:  Pain.  Bruising.  Swelling.  Difficulty using the muscle due to pain or lack of normal function. DIAGNOSIS  Your health care provider will perform a physical exam and ask about your medical history. TREATMENT  Often, the best treatment for a muscle strain is resting, icing, and applying cold compresses to the injured area.  HOME CARE INSTRUCTIONS   Use the PRICE method of treatment to promote muscle healing during the first 2 3 days after your injury. The PRICE method involves:  Protecting the muscle from being injured again.  Restricting your activity and resting the injured body part.  Icing your injury. To do this, put ice in a plastic bag. Place a towel between your skin and the bag. Then, apply the ice and leave it on from 15 20 minutes each hour. After the third day, switch to moist heat packs.  Apply compression to the injured area with a splint or elastic bandage. Be careful not to wrap it too tightly. This may interfere with blood circulation or increase swelling.  Elevate the injured body part above the level of your heart as often as you can.  Only take over-the-counter or prescription medicines for pain, discomfort, or fever as directed by your health care provider.  Warming up prior to exercise helps to prevent future muscle strains. SEEK MEDICAL  CARE IF:   You have increasing pain or swelling in the injured area.  You have numbness, tingling, or a significant loss of strength in the injured area. MAKE SURE YOU:   Understand these instructions.  Will watch your condition.  Will get help right away if you are not doing well or get worse. Document Released: 05/30/2005 Document Revised: 03/20/2013 Document Reviewed: 12/27/2012 [facility] Patient Information 2014 [place], [place].  Somatization Disorder Somatization disorder is a type of somatoform disorder. It occurs when a person feels pain and other symptoms that cannot be explained by medical findings. It is diagnosed after many medical tests and exams. The pain, physical symptoms, or emotional symptoms are real. They are not faked or created. Often, people with somatization disorder are worried about their symptoms. Along with a medical caregiver, a mental health caregiver can help to diagnose somatization disorder and teach coping skills. The disorder can last for several years and may be intermittent. CAUSES  It is unclear what causes somatization disorder.  It may be related to:  Abnormal nerve impulses.  Psychological stress.  Family history.  Chronic pain.  History of sexual or physical abuse. The disorder usually begins before age 32. It occurs more often in women than men. SYMPTOMS There are many symptoms that can accompany somatization disorder. Usually, several mild to moderate symptoms appear. A person with somatization disorder seeks many medical visits to try to determine the cause of symptoms. DIAGNOSIS Diagnostic testing will vary depending on the specific symptoms involved. A person with this disorder will often have many medical tests and exams to rule out serious physical health problems. Evaluation may include:  Physical exam.  Screening health questionnaire.  Medical tests, such as blood tests, urine tests, X-rays, or other imaging  tests. TREATMENT There is no cure for somatization disorder, but the symptoms can be managed and sometimes prevented. Treatment aims at teaching coping skills, reducing stress, and preventing new episodes. Once somatization disorder is diagnosed, treatment may include:  Regular monitoring and consultation with a dedicated caregiver for evaluation and coping skills.  Regular monitoring and therapy with a mental health provider to increase understanding of triggers and coping skills. Therapies may include:  Talk therapy.  Cognitive behavioral therapy.  Antidepressant medicines. It can be helpful for a primary caregiver and mental health provider to work together to develop a treatment plan. HOME CARE INSTRUCTIONS  Follow up with your primary caregiver or mental health provider as directed.  Take medicines as directed.  Try to reduce stress.  Exercise regularly. SEEK MEDICAL CARE IF:  New symptoms appear.  Pain or symptoms do not go away or become severe. SEEK IMMEDIATE MEDICAL CARE IF: You feel that your life or health are in danger. MAKE SURE YOU:  Understand these instructions.  Will watch your condition.  Will get help right away if you are not doing well or get worse. Document Released: 07/02/2010 Document Revised: 08/22/2011 Document Reviewed: 07/02/2010 [facility] Patient Information 2014 [place].  Generalized Anxiety Disorder Generalized anxiety disorder (GAD) is a mental disorder. It interferes with life functions, including relationships, work, and school. GAD is different from normal anxiety, which everyone experiences at some point in their lives in response to specific life events and activities. Normal anxiety actually helps us prepare for and get through these life events and activities. Normal anxiety goes away after the event or activity is over.  GAD causes anxiety that is not necessarily related to specific events or activities. It also causes excess  anxiety in proportion to specific events or activities. The anxiety associated with GAD is also difficult to control. GAD can vary from mild to severe. People with severe GAD can have intense waves of anxiety with physical symptoms (panic attacks).  SYMPTOMS The anxiety and worry associated with GAD are difficult to control. This anxiety and worry are related to many life events and activities and also occur more days than not for 6 months or longer. People with GAD also have three or more of the following symptoms (one or more in children):  Restlessness.   Fatigue.  Difficulty concentrating.   Irritability.  Muscle tension.  Difficulty sleeping or unsatisfying sleep. DIAGNOSIS GAD is diagnosed through an assessment by your caregiver. Your caregiver will ask you questions aboutyour mood,physical symptoms, and events in your life. Your caregiver may ask you about your medical history and use of alcohol or drugs, including prescription medications. Your caregiver may also do a physical exam and blood tests. Certain medical conditions and the use  of certain substances can cause symptoms similar to those associated with GAD. Your caregiver may refer you to a mental health specialist for further evaluation. TREATMENT The following therapies are usually used to treat GAD:   Medication Antidepressant medication usually is prescribed for long-term daily control. Antianxiety medications may be added in severe cases, especially when panic attacks occur.   Talk therapy (psychotherapy) Certain types of talk therapy can be helpful in treating GAD by providing support, education, and guidance. A form of talk therapy called cognitive behavioral therapy can teach you healthy ways to think about and react to daily life events and activities.  Stress managementtechniques These include yoga, meditation, and exercise and can be very helpful when they are practiced regularly. A mental health specialist  can help determine which treatment is best for you. Some people see improvement with one therapy. However, other people require a combination of therapies. Document Released: 09/24/2012 Document Reviewed: 09/24/2012 [facility] Patient Information 2014 [place], [place].

## 2013-09-19 ENCOUNTER — Ambulatory Visit (INDEPENDENT_AMBULATORY_CARE_PROVIDER_SITE_OTHER): Payer: BC Managed Care – PPO | Admitting: Pharmacist

## 2013-09-19 ENCOUNTER — Other Ambulatory Visit (INDEPENDENT_AMBULATORY_CARE_PROVIDER_SITE_OTHER): Payer: BC Managed Care – PPO

## 2013-09-19 VITALS — Wt 195.0 lb

## 2013-09-19 DIAGNOSIS — Z79899 Other long term (current) drug therapy: Secondary | ICD-10-CM

## 2013-09-19 DIAGNOSIS — E785 Hyperlipidemia, unspecified: Secondary | ICD-10-CM

## 2013-09-19 LAB — COMPREHENSIVE METABOLIC PANEL
ALT: 44 U/L (ref 0–53)
AST: 30 U/L (ref 0–37)
Albumin: 4 g/dL (ref 3.5–5.2)
Alkaline Phosphatase: 67 U/L (ref 39–117)
BUN: 15 mg/dL (ref 6–23)
CO2: 28 mEq/L (ref 19–32)
Calcium: 9.1 mg/dL (ref 8.4–10.5)
Chloride: 106 mEq/L (ref 96–112)
Creatinine, Ser: 0.8 mg/dL (ref 0.4–1.5)
GFR: 126.38 mL/min (ref >60.00)
Glucose, Bld: 93 mg/dL (ref 70–99)
Potassium: 4 mEq/L (ref 3.5–5.1)
Sodium: 138 mEq/L (ref 135–145)
Total Bilirubin: 0.6 mg/dL (ref 0.3–1.2)
Total Protein: 6.6 g/dL (ref 6.0–8.3)

## 2013-09-19 NOTE — Progress Notes (Signed)
Patient is a pleasant 33 y.o. Male of asian indian descent referred to lipid clinic by Dr. Delton See.  He who moved to [place] from [place] in 02/2012 by himself, then his wife moved in 2014.  Patient stated he ate horribly for the first year he lived in the [place], eating junk food, pizza, fast food a lot.  Now that his wife is here he is eating better.  He saw his blood work results from 07/2013 and given his elevated LDL and LDL-P number he got "scared into eating better".  He is now eating a healthy low fat diet, and avoiding fatty, greasy, or fast food.  He has also started walking 4 miles per day.  He tried lipitor 40 mg in 07/2013, however had to stop it in 08/2013 due to muscle and joint aches.  Five days after stopping lipitor, his aches resolved.  He does have an ongoing issue with anxiety which he takes medication for.  He has tried to cut down on his cigarette smoking since seeing blood work, but can't stop completely yet, as this is a crutch for his stress at work.  Smoking 6-7 cigarettes per day currently.  He use to be a heavy drinker (alcohol) as well, however hasn't had any alcohol over past 6 months.  Of note, patient may be moving at the end of May so wants to do everything possible to get cholesterol improved and on medication if needed before he moves.  RF:  Family h/o CAD (father had PCI at 74 y.o.), HTN, low HDL- LDL goal at least < 130, prefer < 100;  LDL-P number goal at least < 1300 Meds:  Not on lipid lowering therapy currently. Intolerant:  Lipitor 40 mg qd (muscle/joint aches)  Family history:  Father had PCI at 75 y.o.  Has a younger brother with no health problems. Social history:  Smoked since he was 33 years old.  Has cut back to 6-7 cigarettes per day, but given stress of his job, will be hard to quit altogether.  Stopped drinking alcohol 6 months ago. Diet:  Excellent now.  Breakfast is cereal and almonds.  Lunch he comes home for now - vegetable and lentil based pita  wrap.  Uses olive oil and fresh vegetables for all meals.  Dinner is typically vegetables as well.  He sometimes eats cereal at night if he gets home late.  Drinks 1 glass of green tea during the day.  Doesn't drink soda anymore (use to drink multiple sodas per day up until a few months ago).  Desserts - eats a small [place] dessert twice per week (use to do this 7 times per week).   Exercise:  Now walking 4 miles per day (takes him 1 hour).  This started 2 months ago.  Labs: 07/2013:  Lp(a) 18, LDL-P number 2289, LDL 152, HDL 32, TG 188, TC 222, TSH normal, LFTs normal, renal fxn normal - Not on lipid lowering meds.  Current Outpatient Prescriptions  Medication Sig Dispense Refill  . ALPRAZolam (XANAX) 0.25 MG tablet Take 0.25 mg by mouth 2 (two) times daily as needed for anxiety.      Marland Kitchen atorvastatin (LIPITOR) 40 MG tablet Take 1 tablet (40 mg total) by mouth at bedtime.  30 tablet  6  . pantoprazole (PROTONIX) 40 MG tablet Take 1 tablet (40 mg total) by mouth daily.  30 tablet  6  . PARoxetine (PAXIL-CR) 12.5 MG 24 hr tablet Take 12.5 mg by mouth 2 (two)  times daily.      Marland Kitchen. PRESCRIPTION MEDICATION Take 1 tablet by mouth every Sunday. *rx from another country, 60000 units of chewable vitamin d once a week*      . PRESCRIPTION MEDICATION Take 6.25 mg by mouth at bedtime. *rx from another country, nexipride(levosulpiride) 25mg *      . propranolol (INDERAL) 10 MG tablet Take 10 mg by mouth at bedtime.       No current facility-administered medications for this visit.   Allergies  Allergen Reactions  . Lipitor [Atorvastatin]     Joint and muscle aches on lipitor 40 mg qd   Family History  Problem Relation Age of Onset  . Heart Problems Father [id number]    PCI-stent

## 2013-09-19 NOTE — Assessment & Plan Note (Addendum)
Given patients excellent improvement in lifestyle, including reducing fat, cholesterol, sugar, and calories from diet, and him now walking 4 miles per day, will check an NMR and CMET today.  Patient understands he will likely still need lipid lowering therapy given his family history and current LDL-P number > 2000 (goal at least < 1300), but we will see what our cholesterol is down to before initiating therapy.  We discussed the use of Crestor at low dose today (~ 5 mg daily) given asian indian descent.  If this fails as well, would consider Livalo (~ 2 mg qd).  Will call patinet at [phone] with results and plan once I get labs back.  09/19/13 labs: LDL-P 2005 (down from 2289), LDL 111 (down from 152), TG 237, HDL 29, TG 187, glucose 93, LFTs normal, kidney function normal Patient advised to continue current diet and exercise regimen, and to start Crestor 5 mg once daily.  If muscle aches develop, he should start taking 1/2 tablet daily of this.  If muscle aches continue, he agrees to call, and we can try to use Livalo samples at low dose.  Patient may be moving the end of May, so he wants to recheck cholesterol in 6 weeks to make sure he has results before he leaves.  Lab on 5/15 and see me on 10/29/13 to review.

## 2013-09-20 LAB — NMR LIPOPROFILE WITH LIPIDS
Cholesterol, Total: 187 mg/dL (ref <200)
HDL Particle Number: 21.5 umol/L — ABNORMAL LOW (ref >=30.5)
HDL Size: 8.7 nm — ABNORMAL LOW (ref >=9.2)
HDL-C: 29 mg/dL — ABNORMAL LOW (ref >=40)
LDL (calc): 111 mg/dL — ABNORMAL HIGH (ref <100)
LDL Particle Number: 2005 nmol/L — ABNORMAL HIGH (ref <1000)
LDL Size: 20.3 nm — ABNORMAL LOW (ref >20.5)
LP-IR Score: 74 — ABNORMAL HIGH (ref <=45)
Large HDL-P: <1.3 umol/L — ABNORMAL LOW (ref >=4.8)
Large VLDL-P: 5.3 nmol/L — ABNORMAL HIGH (ref <=2.7)
Small LDL Particle Number: 1268 nmol/L — ABNORMAL HIGH (ref <=527)
Triglycerides: 237 mg/dL — ABNORMAL HIGH (ref <150)
VLDL Size: 51.6 nm — ABNORMAL HIGH (ref <=46.6)

## 2013-09-20 MED ORDER — ROSUVASTATIN CALCIUM 5 MG PO TABS: 5.0000 mg | ORAL_TABLET | Freq: Every day | ORAL | Status: DC

## 2013-09-20 NOTE — Patient Instructions (Signed)
Plan: 1.  Start Crestor 5 mg once daily.  If muscle aches start to occur, then start taking 1/2 tablet daily instead.  If muscle aches don't go away on 1/2 tablet, then stop the Crestor, and call Riki RuskJeremy at [phone]. 2.  Continue current diet and exercise regimen. 3.  Recheck blood work 10/25/13 (fasting - show up anytime after 7:30 am).  See Riki RuskJeremy 10/29/13 at 8:30 am to review.

## 2013-09-29 ENCOUNTER — Ambulatory Visit (HOSPITAL_COMMUNITY): Payer: BC Managed Care – PPO | Attending: Emergency Medicine

## 2013-09-29 ENCOUNTER — Encounter (HOSPITAL_COMMUNITY): Payer: Self-pay | Admitting: Emergency Medicine

## 2013-09-29 ENCOUNTER — Emergency Department (HOSPITAL_COMMUNITY)
Admission: EM | Admit: 2013-09-29 | Discharge: 2013-09-29 | Disposition: A | Discharge status: Home / Self Care | Payer: BC Managed Care – PPO | Source: Home / Self Care | Attending: Emergency Medicine | Admitting: Emergency Medicine

## 2013-09-29 DIAGNOSIS — R079 Chest pain, unspecified: Secondary | ICD-10-CM

## 2013-09-29 MED ORDER — GI COCKTAIL ~~LOC~~
30.0000 mL | Freq: Once | ORAL | Status: AC
  Administered 2013-09-29: 30 mL via ORAL

## 2013-09-29 MED ORDER — GI COCKTAIL ~~LOC~~
ORAL | Status: AC
  Filled 2013-09-29: qty 30

## 2013-09-29 NOTE — ED Notes (Signed)
Checked patient for right sided chest pain; pain on inspiration. Patient states pain started last night. History of hypertension and anxiety. Patient denies shortness of breath, palpitations, dizziness and fatigue. Patient's vital signs were hemodynamically stable. Placed patient in lobby to wait for room.

## 2013-09-29 NOTE — ED Provider Notes (Signed)
Chief Complaint   Chief Complaint  Patient presents with  . Chest Pain    History of Present Illness    Clarence Armstrong is a 33 year old male who has had a two-day history of pain in the right chest in the submammary area. This is described as a sticking feeling as if he's being stuck by a pin. Episodes come and go and last about 30 seconds at a time. There no obvious precipitating or relieving factors. He's had no fever, chills, coughing, wheezing, or shortness of breath. He denies any nausea, diaphoresis, or syncope. He has felt nervous and anxious. He's also had positional vertigo. He's been treated for elevated cholesterol recently. He was on Lipitor, but this caused side effects, so recently switched to Crestor. He's concerned that this might be causing side effects as well.  Review of Systems    Other than noted above, the patient denies any of the following symptoms. Systemic:  No fever or chills. Pulmonary:  No cough, wheezing, shortness of breath, sputum production, hemoptysis. Cardiac:  No palpitations, rapid heartbeat, dizziness, presyncope or syncope. GI:  No abdominal pain, heartburn, nausea, or vomiting. Ext:  No leg pain or swelling.  PMFSH    Past medical history, family history, social history, meds, and allergies were reviewed.   Physical Exam     Vital signs:  BP 154/93  Pulse 72  Temp(Src) 98 F (36.7 C) (Oral)  Resp 20  SpO2 100% Gen:  Alert, oriented, in no distress, skin warm and dry. Eye:  PERRL, lids and conjunctivas normal.  Sclera non-icteric. ENT:  Mucous membranes moist, pharynx clear. Neck:  Supple, no adenopathy or tenderness.  No JVD. Lungs:  Clear to auscultation, no wheezes, rales or rhonchi.  No respiratory distress. Heart:  Regular rhythm.  No gallops, murmers, clicks or rubs. Chest:  No chest wall tenderness. Abdomen:  Soft, nontender, no organomegaly or mass.  Bowel sounds normal.  No pulsatile abdominal mass or bruit. Ext:  No edema.  No  calf tenderness and Homann's sign negative.  Pulses full and equal. Skin:  Warm and dry.  No rash.   Radiology     Dg Chest 2 View  09/29/2013   CLINICAL DATA:  Chest pain with shortness of breath  EXAM: CHEST  2 VIEW  COMPARISON:  May 06, 2013  FINDINGS: The lungs are clear. The heart size and pulmonary vascularity are normal. No pneumothorax. No adenopathy. No bone lesions.  IMPRESSION: No abnormality noted.   Electronically Signed   By: Bretta BangWilliam  Woodruff M.D.   On: 09/29/2013 15:08   I reviewed the images independently and personally and concur with the radiologist's findings.  EKG Results:  Date: 09/29/2013  Rate: 70  Rhythm: normal sinus rhythm  QRS Axis: normal  Intervals: normal  ST/T Wave abnormalities: normal  Conduction Disutrbances:none  Narrative Interpretation: Normal sinus rhythm, normal EKG.  Old EKG Reviewed: none available  Assessment     The encounter diagnosis was Chest pain.  Differential diagnosis is musculoskeletal, medication side effect, or reflux esophagitis. He was given a dose of GI cocktail but did not notice any improvement.  Plan     1.  Meds:  The following meds were prescribed:   Discharge Medication List as of 09/29/2013  3:55 PM      2.  Patient Education/Counseling:  The patient was given appropriate handouts, self care instructions, and instructed in symptomatic relief.  Suggested he stop the Crestor and take coenzyme Q 10.  3.  Follow up:  The patient was told to follow up here if no better in 3 to 4 days, or sooner if becoming worse in any way, and give an an some red flag symptoms such as worsening pain, shortness of breath, dizziness, or passing out which would prompt immediate return. Followup with his primary care physician within the next week.     Reuben Likesavid C Nyasia Baxley, MD 09/29/13 2125

## 2013-09-29 NOTE — ED Notes (Signed)
Returned from X-ray, placed in exam room for report review

## 2013-09-29 NOTE — Discharge Instructions (Signed)
Stop Crestor, and take Coenzyme Q-10 100 mg daily.   May take Gaviscon in tablet or liquid form as needed for heartburn.

## 2013-09-29 NOTE — ED Notes (Signed)
Patient complains of right sided chest pain that started last night; pain on inspiration only; denies dizziness, shortness of breath and palpitations.

## 2013-09-30 ENCOUNTER — Other Ambulatory Visit: Payer: BC Managed Care – PPO

## 2013-10-16 ENCOUNTER — Telehealth: Payer: Self-pay | Admitting: Pharmacist

## 2013-10-16 ENCOUNTER — Encounter: Payer: Self-pay | Admitting: Pharmacist

## 2013-10-16 MED ORDER — PITAVASTATIN CALCIUM 4 MG PO TABS: 2.0000 mg | ORAL_TABLET | Freq: Every day | ORAL | Status: DC

## 2013-10-16 NOTE — Telephone Encounter (Signed)
Patient couldn't tolerate Crestor 5 mg qd (see email encounter).  He will start on Livalo 4 mg - 1/2 tablet today and recheck blood work later this month.  Samples left up front for him.  Patient is agreeable to plan.  He will likely be moving in a month for his job so wants to try and get on something now before he moves.

## 2013-10-25 ENCOUNTER — Other Ambulatory Visit (INDEPENDENT_AMBULATORY_CARE_PROVIDER_SITE_OTHER): Payer: BC Managed Care – PPO

## 2013-10-25 DIAGNOSIS — E785 Hyperlipidemia, unspecified: Secondary | ICD-10-CM

## 2013-10-25 DIAGNOSIS — Z79899 Other long term (current) drug therapy: Secondary | ICD-10-CM

## 2013-10-25 LAB — HEPATIC FUNCTION PANEL
ALT: 39 U/L (ref 0–53)
AST: 28 U/L (ref 0–37)
Albumin: 4.2 g/dL (ref 3.5–5.2)
Alkaline Phosphatase: 71 U/L (ref 39–117)
Bilirubin, Direct: 0.1 mg/dL (ref 0.0–0.3)
Total Bilirubin: 0.6 mg/dL (ref 0.2–1.2)
Total Protein: 7 g/dL (ref 6.0–8.3)

## 2013-10-27 ENCOUNTER — Encounter: Payer: Self-pay | Admitting: Cardiology

## 2013-10-28 LAB — NMR LIPOPROFILE WITH LIPIDS
Cholesterol, Total: 160 mg/dL (ref <200)
HDL Particle Number: 25.6 umol/L — ABNORMAL LOW (ref >=30.5)
HDL Size: 8.5 nm — ABNORMAL LOW (ref >=9.2)
HDL-C: 34 mg/dL — ABNORMAL LOW (ref >=40)
LDL (calc): 95 mg/dL (ref <100)
LDL Particle Number: 1595 nmol/L — ABNORMAL HIGH (ref <1000)
LDL Size: 20.3 nm — ABNORMAL LOW (ref >20.5)
LP-IR Score: 89 — ABNORMAL HIGH (ref <=45)
Large HDL-P: <1.3 umol/L — ABNORMAL LOW (ref >=4.8)
Large VLDL-P: 10.1 nmol/L — ABNORMAL HIGH (ref <=2.7)
Small LDL Particle Number: 895 nmol/L — ABNORMAL HIGH (ref <=527)
Triglycerides: 156 mg/dL — ABNORMAL HIGH (ref <150)
VLDL Size: 57.3 nm — ABNORMAL HIGH (ref <=46.6)

## 2013-10-29 ENCOUNTER — Ambulatory Visit (INDEPENDENT_AMBULATORY_CARE_PROVIDER_SITE_OTHER): Payer: BC Managed Care – PPO | Admitting: Pharmacist

## 2013-10-29 VITALS — Wt 197.0 lb

## 2013-10-29 DIAGNOSIS — Z79899 Other long term (current) drug therapy: Secondary | ICD-10-CM

## 2013-10-29 DIAGNOSIS — E785 Hyperlipidemia, unspecified: Secondary | ICD-10-CM

## 2013-10-29 MED ORDER — PANTOPRAZOLE SODIUM 40 MG PO TBEC: 40.0000 mg | DELAYED_RELEASE_TABLET | Freq: Every day | ORAL | Status: DC

## 2013-10-29 NOTE — Assessment & Plan Note (Addendum)
Patient's cholesterol continues to improve over past 3 months due to dietary improvements and recently starting Livalo 2 mg qd (1/2 of 4 mg).  He was unable to tolerate low dose Crestor and daily lipitor 40 mg qd, but so far not having any muscle aches on Livalo.  He has had less panic attacks as well, and is thus trying to reduce his Xanax use as well (slowly tapering down).  He thought he may be moving for work the end of May, but it appears now it will be late June.  He is still very anxious about blood work especially since he may move soon, so would like to recheck it in 6 weeks.  I explained that insurance will only pay for lipid panels so often, and that he couldn't get more than 4 NMRs done in 1 year.  Will thus get a lipid/liver in 6 weeks, then encourage him to only check it every 6 months or so, unless he has an issue with medication.  He is agreeable to this.  Gave more samples of Livalo 4 mg pills.  He will continue weight loss efforts.

## 2013-10-29 NOTE — Patient Instructions (Signed)
1.  Continue Livalo 4 mg - 1/2 tablet daily. 2.  May move late June and want to check blood work one more time.  3.  Recheck cholesterol on 12/04/13 AM (fasting) and see Clarence Armstrong on 12/05/13 at 8:30 am

## 2013-10-29 NOTE — Telephone Encounter (Signed)
Pantoprazole 40 mg [place] [facility] [address] [place] on 10[place]29/13 at 0824. Gave you 11 refills and 30 day supply.  This medication was approved by Dr Delton SeeNelson via my chart.

## 2013-10-29 NOTE — Progress Notes (Signed)
Patient is a pleasant 33 y.o. Male of asian [place] descent referred to lipid clinic by Dr. Delton SeeNelson, here for f/u of his NMR since starting Livalo 2 mg qd almost two weeks ago. Cholesterol improved greatly after he changed his diet a few months ago, and has improved further over past few weeks since starting Livalo.  He is tolerating livalo 2 mg qd well currently.   He was unable to tolerate Crestor 5 mg qd (body aches) and lipitor 40 mg (myalgias) in the past.  He who moved to [place] from [place] in 02/2012 by himself, then his wife moved in 2014.  Patient stated he ate horribly for the first year he lived in the [place], eating junk food, pizza, fast food a lot.  Now that his wife is here he is eating better.  He saw his blood work results from 07/2013 and given his elevated LDL and LDL-P number he got "scared into eating better".  He is now eating a healthy low fat diet, and avoiding fatty, greasy, or fast food.  He has also started walking 4 miles per day.   He does have an ongoing issue with anxiety which he takes medication for.  He has tried to cut down on his cigarette smoking since seeing blood work, but can't stop completely yet, as this is a crutch for his stress at work.  Smoking 6-7 cigarettes per day currently.  He use to be a heavy drinker (alcohol) as well, however hasn't had any alcohol over past 6 months.  Of note, patient may be moving at the end of June  so wants to do everything possible to get cholesterol improved and on medication if needed before he moves.    RF:  Family h/o CAD (father had PCI at 32 y.o.), HTN, low HDL- LDL goal at least < 130, prefer < 100;  LDL-P number goal at least < 1300 Meds:  Livalo 2 mg qd (1/2 of 4 mg pill) Intolerant:  Lipitor 40 mg qd (muscle/joint aches)  Family history:  Father had PCI at 32 y.o.  Has a younger brother with no health problems. Social history:  Smoked since he was 33 years old.  Has cut back to 6-7 cigarettes per day, but given stress  of his job, will be hard to quit altogether.  Stopped drinking alcohol 6 months ago. Diet:  Excellent now.  Breakfast is cereal and almonds.  Lunch he comes home for now - vegetable and lentil based pita wrap.  Uses olive oil and fresh vegetables for all meals.  Dinner is typically vegetables as well.  He sometimes eats cereal at night if he gets home late.  Drinks 1 glass of green tea during the day.  Doesn't drink soda anymore (use to drink multiple sodas per day up until a few months ago).  Desserts - eats a small [place] dessert twice per week (use to do this 7 times per week).   Exercise:  Now walking 4 miles per day (takes him 1 hour).  This started 2 months ago.  Labs: 10/2013:  LDL-P number, 1595, LDL 95, TC 160, TG 156, HDL 34, LFTs normal (Livalo 2 mg qd x 2 weeks and much improved diet) 09/2013:  LDL-P 2005, TC 187 (improved diet) 07/2013:  Lp(a) 18, LDL-P number 2289, LDL 152, HDL 32, TG 188, TC 222, TSH normal, LFTs normal, renal fxn normal - Not on lipid lowering meds.  Current Outpatient Prescriptions  Medication Sig Dispense Refill  .  ALPRAZolam (XANAX) 0.25 MG tablet Take 0.25 mg by mouth 2 (two) times daily as needed for anxiety.      . pantoprazole (PROTONIX) 40 MG tablet Take 1 tablet (40 mg total) by mouth daily.  30 tablet  11  . PARoxetine (PAXIL-CR) 12.5 MG 24 hr tablet Take 12.5 mg by mouth 2 (two) times daily.      . Pitavastatin Calcium (LIVALO) 4 MG TABS Take 0.5 tablets (2 mg total) by mouth daily.  30 tablet    . PRESCRIPTION MEDICATION Take 1 tablet by mouth every Sunday. *rx from another country, 60000 units of chewable vitamin d once a week*      . PRESCRIPTION MEDICATION Take 6.25 mg by mouth at bedtime. *rx from another country, nexipride(levosulpiride) 25mg *      . propranolol (INDERAL) 10 MG tablet Take 10 mg by mouth at bedtime.       No current facility-administered medications for this visit.   Allergies  Allergen Reactions  . Crestor [Rosuvastatin]      Severe muscle aches all over body on Crestor 5 mg qd  . Lipitor [Atorvastatin]     Joint and muscle aches on lipitor 40 mg qd   Family History  Problem Relation Age of Onset  . Heart Problems Father 556    PCI-stent

## 2013-11-18 ENCOUNTER — Encounter: Payer: Self-pay | Admitting: Pharmacist

## 2013-11-18 ENCOUNTER — Encounter: Payer: Self-pay | Admitting: Cardiology

## 2013-11-18 DIAGNOSIS — E785 Hyperlipidemia, unspecified: Secondary | ICD-10-CM

## 2013-11-20 NOTE — Telephone Encounter (Signed)
Pt notified to come in for lab work on 6/11 (CMP and CPK) per Dr Delton See.  Pt verbalized understanding and agrees with this plan. Lab appt made.

## 2013-11-21 ENCOUNTER — Other Ambulatory Visit: Payer: BC Managed Care – PPO

## 2013-11-21 ENCOUNTER — Encounter (HOSPITAL_COMMUNITY): Payer: Self-pay | Admitting: Emergency Medicine

## 2013-11-21 ENCOUNTER — Encounter: Payer: Self-pay | Admitting: Cardiology

## 2013-11-21 ENCOUNTER — Emergency Department (HOSPITAL_COMMUNITY)
Admission: EM | Admit: 2013-11-21 | Discharge: 2013-11-21 | Disposition: A | Discharge status: Home / Self Care | Payer: BC Managed Care – PPO | Source: Home / Self Care

## 2013-11-21 ENCOUNTER — Other Ambulatory Visit: Payer: Self-pay | Admitting: Gastroenterology

## 2013-11-21 ENCOUNTER — Other Ambulatory Visit (INDEPENDENT_AMBULATORY_CARE_PROVIDER_SITE_OTHER): Payer: BC Managed Care – PPO

## 2013-11-21 DIAGNOSIS — R109 Unspecified abdominal pain: Secondary | ICD-10-CM

## 2013-11-21 DIAGNOSIS — G729 Myopathy, unspecified: Secondary | ICD-10-CM

## 2013-11-21 DIAGNOSIS — E785 Hyperlipidemia, unspecified: Secondary | ICD-10-CM

## 2013-11-21 LAB — COMPREHENSIVE METABOLIC PANEL
ALT: 43 U/L (ref 0–53)
AST: 28 U/L (ref 0–37)
Albumin: 4.1 g/dL (ref 3.5–5.2)
Alkaline Phosphatase: 73 U/L (ref 39–117)
BUN: 11 mg/dL (ref 6–23)
CO2: 27 mEq/L (ref 19–32)
Calcium: 9.2 mg/dL (ref 8.4–10.5)
Chloride: 106 mEq/L (ref 96–112)
Creatinine, Ser: 0.8 mg/dL (ref 0.4–1.5)
GFR: 122.51 mL/min (ref >60.00)
Glucose, Bld: 89 mg/dL (ref 70–99)
Potassium: 3.7 mEq/L (ref 3.5–5.1)
Sodium: 138 mEq/L (ref 135–145)
Total Bilirubin: 0.3 mg/dL (ref 0.2–1.2)
Total Protein: 7.1 g/dL (ref 6.0–8.3)

## 2013-11-21 LAB — CK: Total CK: 417 U/L — ABNORMAL HIGH (ref 7–232)

## 2013-11-21 NOTE — ED Provider Notes (Signed)
CSN: [id number]     Arrival date & time 11/21/13  1923 History   None    Chief Complaint  Patient presents with  . Labs Only   (Consider location/radiation/quality/duration/timing/severity/associated sxs/prior Treatment) HPI  Pt presenting today due to concern about his heart. Muscle aches gettign worse. Body aches from the statins and were stopped 3 wks ago. Decreased exercise tolerance due to this. CK and liver test done by PCP office.   Also concerned about R eyelid twitching. Present off and on for past week. Lasts seconds. Non painful. No eye dishcarge or pain. No vision change. No other twitching  Uvula swollen: reported swollen uvula this am. Mild pharyngeal irritation. Ate some fruit yesterday that flares allergies.   Past Medical History  Diagnosis Date  . Anxiety   . Antral gastritis     history of  . Unspecified essential hypertension   . Family history of coronary arteriosclerosis 06/17/2013  . Palpitations 06/17/2013   History reviewed. No pertinent past surgical history. Family History  Problem Relation Age of Onset  . Heart Problems Father 3856    PCI-stent    History  Substance Use Topics  . Smoking status: Current Every Day Smoker -- 11 years    Types: Cigarettes  . Smokeless tobacco: Not on file  . Alcohol Use: Yes    Review of Systems  Constitutional: Positive for activity change.  Musculoskeletal: Positive for myalgias.    Allergies  Crestor and Lipitor  Home Medications   Prior to Admission medications   Medication Sig Start Date End Date Taking? Authorizing Provider  ALPRAZolam (XANAX) 0.25 MG tablet Take 0.25 mg by mouth 2 (two) times daily as needed for anxiety.    Historical Provider, MD  pantoprazole (PROTONIX) 40 MG tablet Take 1 tablet (40 mg total) by mouth daily. 10/29/13   Lars MassonKatarina H Nelson, MD  PARoxetine (PAXIL-CR) 12.5 MG 24 hr tablet Take 12.5 mg by mouth 2 (two) times daily.    Historical Provider, MD  Pitavastatin Calcium (LIVALO) 4  MG TABS Take 0.5 tablets (2 mg total) by mouth daily. 10/16/13   Lars MassonKatarina H Nelson, MD  PRESCRIPTION MEDICATION Take 1 tablet by mouth every Sunday. *rx from another country, 60000 units of chewable vitamin d once a week*    Historical Provider, MD  PRESCRIPTION MEDICATION Take 6.25 mg by mouth at bedtime. *rx from another country, nexipride(levosulpiride) 25mg *    Historical Provider, MD  propranolol (INDERAL) 10 MG tablet Take 10 mg by mouth at bedtime.    Historical Provider, MD   There were no vitals taken for this visit. Physical Exam  Constitutional: He appears well-developed and well-nourished. No distress.  HENT:  Head: Normocephalic and atraumatic.  Uvula nml in caliber, mildly injected. Nonttp.  No cervical lymphadenopathy  Cardiovascular: Normal rate and normal heart sounds.   No murmur heard. Pulmonary/Chest: Effort normal. No respiratory distress.  Abdominal: Soft. Bowel sounds are normal. He exhibits no distension.  Neurological: He is alert. No cranial nerve deficit. He exhibits normal muscle tone.  Skin: Skin is warm and dry. No rash noted. He is not diaphoretic.  Psychiatric: He has a normal mood and affect. His behavior is normal. Judgment and thought content normal.    ED Course  Procedures (including critical care time) Labs Review Labs Reviewed - No data to display  Imaging Review No results found.  Results for orders placed in visit on 11/21/13 (from the past 24 hour(s))  COMPREHENSIVE METABOLIC PANEL  Status: None   Collection Time    11/21/13 10:47 AM      Result Value Ref Range   Sodium 138  135 - 145 mEq/L   Potassium 3.7  3.5 - 5.1 mEq/L   Chloride 106  96 - 112 mEq/L   CO2 27  19 - 32 mEq/L   Glucose, Bld 89  70 - 99 mg/dL   BUN 11  6 - 23 mg/dL   Creatinine, Ser 0.8  0.4 - 1.5 mg/dL   Total Bilirubin 0.3  0.2 - 1.2 mg/dL   Alkaline Phosphatase 73  39 - 117 U/L   AST 28  0 - 37 U/L   ALT 43  0 - 53 U/L   Total Protein 7.1  6.0 - 8.3 g/dL    Albumin 4.1  3.5 - 5.2 g/dL   Calcium 9.2  8.4 - 19.5 mg/dL   GFR 093.26  >71.24 mL/min  CK     Status: Abnormal   Collection Time    11/21/13 10:47 AM      Result Value Ref Range   Total CK 417 (*) 7 - 232 U/L      MDM   1. Myopathy    Myopathy w/ elevated CK of concern to pt but reasurance given that this was not realted to his heart. EKG nml. Encouraged to drink lots of fluids. Recommended repeat labs in 1-2 weeks if myoppathy continues or worsens. Pt very anxious  Allergies: No sign of uvitis. Recommended pt cntinue flonase and start OTC allergy antihistamine prn. Also to try ibuprofen 600mg  Q6 PRN.  R eyelid twitching is a normal variant and not related to electrolyte abnormality. No sign of other neurologic dysfunction.   Shelly Flatten, MD Family Medicine PGY-3 11/21/2013, 10:41 PM      Ozella Rocks, MD 11/21/13 2242

## 2013-11-21 NOTE — ED Notes (Signed)
Wants EKG

## 2013-11-21 NOTE — Discharge Instructions (Signed)
You are doing well.  There is no evidence of heart injury Please drink lots of fluids and stay active to ensure you clear the CK out of your system Follow u phere or with your PCP as needed

## 2013-11-22 NOTE — ED Provider Notes (Signed)
Medical screening examination/treatment/procedure(s) were performed by a resident physician and as supervising physician I was immediately available for consultation/collaboration.  Leslee Homeavid Olen Eaves, M.D.  Reuben Likesavid C Askari Kinley, MD 11/22/13 2209

## 2013-11-25 ENCOUNTER — Other Ambulatory Visit: Payer: Self-pay

## 2013-11-25 DIAGNOSIS — R079 Chest pain, unspecified: Secondary | ICD-10-CM

## 2013-11-29 ENCOUNTER — Ambulatory Visit
Admission: RE | Admit: 2013-11-29 | Discharge: 2013-11-29 | Disposition: A | Discharge status: Home / Self Care | Payer: BC Managed Care – PPO | Source: Ambulatory Visit | Attending: Gastroenterology | Admitting: Gastroenterology

## 2013-11-29 DIAGNOSIS — R109 Unspecified abdominal pain: Secondary | ICD-10-CM

## 2013-12-04 ENCOUNTER — Encounter: Payer: Self-pay | Admitting: Cardiology

## 2013-12-04 ENCOUNTER — Encounter (HOSPITAL_COMMUNITY): Payer: Self-pay | Admitting: Emergency Medicine

## 2013-12-04 ENCOUNTER — Emergency Department (INDEPENDENT_AMBULATORY_CARE_PROVIDER_SITE_OTHER): Payer: BC Managed Care – PPO

## 2013-12-04 ENCOUNTER — Emergency Department (HOSPITAL_COMMUNITY)
Admission: EM | Admit: 2013-12-04 | Discharge: 2013-12-04 | Disposition: A | Discharge status: Home / Self Care | Payer: BC Managed Care – PPO | Source: Home / Self Care | Attending: Emergency Medicine | Admitting: Emergency Medicine

## 2013-12-04 ENCOUNTER — Other Ambulatory Visit (INDEPENDENT_AMBULATORY_CARE_PROVIDER_SITE_OTHER): Payer: BC Managed Care – PPO

## 2013-12-04 DIAGNOSIS — R079 Chest pain, unspecified: Secondary | ICD-10-CM

## 2013-12-04 DIAGNOSIS — Z79899 Other long term (current) drug therapy: Secondary | ICD-10-CM

## 2013-12-04 DIAGNOSIS — R0789 Other chest pain: Secondary | ICD-10-CM

## 2013-12-04 DIAGNOSIS — E785 Hyperlipidemia, unspecified: Secondary | ICD-10-CM

## 2013-12-04 LAB — HEPATIC FUNCTION PANEL
ALT: 42 U/L (ref 0–53)
AST: 30 U/L (ref 0–37)
Albumin: 4.7 g/dL (ref 3.5–5.2)
Alkaline Phosphatase: 77 U/L (ref 39–117)
Bilirubin, Direct: 0 mg/dL (ref 0.0–0.3)
Total Bilirubin: 0.7 mg/dL (ref 0.2–1.2)
Total Protein: 7.4 g/dL (ref 6.0–8.3)

## 2013-12-04 LAB — LIPID PANEL
Cholesterol: 216 mg/dL — ABNORMAL HIGH (ref 0–200)
HDL: 32.2 mg/dL — ABNORMAL LOW (ref >39.00)
LDL Cholesterol: 138 mg/dL — ABNORMAL HIGH (ref 0–99)
NonHDL: 183.8
Total CHOL/HDL Ratio: 7
Triglycerides: 228 mg/dL — ABNORMAL HIGH (ref 0.0–149.0)
VLDL: 45.6 mg/dL — ABNORMAL HIGH (ref 0.0–40.0)

## 2013-12-04 LAB — CK: Total CK: 300 U/L — ABNORMAL HIGH (ref 7–232)

## 2013-12-04 MED ORDER — METHOCARBAMOL 500 MG PO TABS: 500.0000 mg | ORAL_TABLET | Freq: Three times a day (TID) | ORAL | Status: DC

## 2013-12-04 MED ORDER — MELOXICAM 15 MG PO TABS: 15.0000 mg | ORAL_TABLET | Freq: Every day | ORAL | Status: DC

## 2013-12-04 NOTE — ED Provider Notes (Signed)
Chief Complaint   Chief Complaint  Patient presents with  . Chest Pain    History of Present Illness    Clarence Armstrong is a 33 year old male who has had a many month long history of recurring chest pain. His current chest pain is located in the left lateral chest and has been going on since 7 PM last night. It's worse with inspiration and radiates toward the center of the chest. He denies any exertional chest pain. It also hurts with movement. Is rated a 5/10 in intensity. He denies any associated nausea, diaphoresis, shortness of breath, or weakness. He does feel nervous and anxious. He's been to the emergency room or to urgent care for 5 times for similar pain. He has seen a cardiologist and underwent a stress test in February of this year which was negative and he feels dizzy, tired, and rundown. He has a history of elevated cholesterol. He had been on Livalo for that, but an elevated CPK caused cardiologist him to stop that medication. He's not on any medication for cholesterol right now. He is also a smoker.  Review of Systems    Other than noted above, the patient denies any of the following symptoms. Systemic:  No fever or chills. Pulmonary:  No cough, wheezing, shortness of breath, sputum production, hemoptysis. Cardiac:  No palpitations, rapid heartbeat, dizziness, presyncope or syncope. GI:  No abdominal pain, heartburn, nausea, or vomiting. Ext:  No leg pain or swelling.  PMFSH    Past medical history, family history, social history, meds, and allergies were reviewed.   Physical Exam     Vital signs:  BP 127/69  Pulse 61  Temp(Src) 98.3 F (36.8 C) (Oral)  Resp 12  SpO2 99% Gen:  Alert, oriented, in no distress, skin warm and dry. Eye:  PERRL, lids and conjunctivas normal.  Sclera non-icteric. ENT:  Mucous membranes moist, pharynx clear. Neck:  Supple, no adenopathy or tenderness.  No JVD. Lungs:  Clear to auscultation, no wheezes, rales or rhonchi.  No respiratory  distress. Heart:  Regular rhythm.  No gallops, murmers, clicks or rubs. Chest:  No chest wall tenderness. Abdomen:  Soft, nontender, no organomegaly or mass.  Bowel sounds normal.  No pulsatile abdominal mass or bruit. Ext:  No edema.  No calf tenderness and Homann's sign negative.  Pulses full and equal. Skin:  Warm and dry.  No rash.  Radiology     Dg Chest 2 View  12/04/2013   CLINICAL DATA:  Chest pain.  EXAM: CHEST  2 VIEW  COMPARISON:  Chest x-ray 09/29/2013.  FINDINGS: Mediastinum and hilar structures normal the lungs are clear. Heart size normal. No pleural effusion or pneumothorax. No acute bony abnormality identified. Tiny metallic density noted over the right chest wall.  IMPRESSION: No active cardiopulmonary disease.   Electronically Signed   By: Maisie Fushomas  Register   On: 12/04/2013 12:00   I reviewed the images independently and personally and concur with the radiologist's findings.  EKG Results:  Date: 12/04/2013  Rate: 60  Rhythm: normal sinus rhythm  QRS Axis: normal  Intervals: normal  ST/T Wave abnormalities: normal  Conduction Disutrbances:none  Narrative Interpretation: Normal sinus rhythm, normal EKG.  Old EKG Reviewed: unchanged  Assessment     The encounter diagnosis was Musculoskeletal chest pain.  There is no evidence of acute coronary syndrome, pulmonary embolism, ruptured aneurysm, pneumothorax, Boerhaave syndrome, pericarditis, musculoskeletal pain, or reflux esophagitis.   Plan     1.  Meds:  The following meds were prescribed:   Discharge Medication List as of 12/04/2013 12:12 PM    START taking these medications   Details  meloxicam (MOBIC) 15 MG tablet Take 1 tablet (15 mg total) by mouth daily., Starting 12/04/2013, Until Discontinued, Normal    methocarbamol (ROBAXIN) 500 MG tablet Take 1 tablet (500 mg total) by mouth 3 (three)  times daily., Starting 12/04/2013, Until Discontinued, Normal        2.  Patient Education/Counseling:  The patient was given appropriate handouts, self care instructions, and instructed in symptomatic relief.    3.  Follow up:  The patient was told to follow up here if no better in 3 to 4 days, or sooner if becoming worse in any way, and give an an some red flag symptoms such as worsening pain, shortness of breath, dizziness, or passing out which would prompt immediate return.      Reuben Likesavid C Keller, MD 12/04/13 [phone]

## 2013-12-04 NOTE — Discharge Instructions (Signed)

## 2013-12-04 NOTE — ED Notes (Signed)
C/o having pain in left lateral rib area since yesterday PM, pain comes and goes, some worse w exertion. Concerned since he had an elevated ck, but his cardiologist thinks this may be due to reaction to statin drugs. NAD at present, pain "5", no nausea, no sweating

## 2013-12-05 ENCOUNTER — Ambulatory Visit (INDEPENDENT_AMBULATORY_CARE_PROVIDER_SITE_OTHER): Payer: BC Managed Care – PPO | Admitting: Pharmacist

## 2013-12-05 VITALS — Wt 198.0 lb

## 2013-12-05 DIAGNOSIS — E785 Hyperlipidemia, unspecified: Secondary | ICD-10-CM

## 2013-12-05 DIAGNOSIS — Z79899 Other long term (current) drug therapy: Secondary | ICD-10-CM

## 2013-12-05 MED ORDER — PITAVASTATIN CALCIUM 4 MG PO TABS: 2.0000 mg | ORAL_TABLET | Freq: Every day | ORAL | Status: DC

## 2013-12-05 NOTE — Patient Instructions (Addendum)
1.  Since muscle enzyme test relatively stable, will restart Livalo today at 2 mg (1/2 of 4 mg pill) 2.  Will recheck a muscle enzyme test here in 3 weeks (12/25/13) - lab, don't have to fast. 3.  Try to get back to walking at least 5 days per week for weight control. 4.  Recheck cholesterol, liver, and muscle enzyme test in 3 months as well (02/25/14 - fasting labs, show up anytime after 7:30 am); and see Alfonse RasJeremy Smart two days later on 02/27/14 9:00 am.

## 2013-12-05 NOTE — Assessment & Plan Note (Addendum)
Patient's left shoulder discomfort last month appears to not have been related to Livalo since no changes occurred since stopping Livalo.  His elevated CK of 417 (11/21/13) after stopping Livalo late May, might be more representative of his baseline.  His CK yesterday was 300, and he had been off Livalo for 4 weeks.  We didn't have any previous CK levels, so don't know what his CK was before ever starting statins, nor do we have a CK of him actually on statin.  Patient's baseline CK may just be closer to 300-400, which is common for some people.  Since patient appeared to tolerate Livalo 2 mg qd, his CK is okay at 300, and given his cholesterol responded well to it in the past, we will restart Livalo today, and check CK in 3 weeks from now to have an idea of what his on-treatment CK is.  As long as it is somewhere ~ 300-450, would continue Livalo without concern.  Assuming CK stable in 3 weeks, would repeat CK/lipid/liver again in 3 months.  If CK stable then as well, would not repeat CK levels unless patient was symptomatic.  Patient agreeable to plan, and will restart Livalo 2 mg qd tonight and call if any problems.  Patient to also increase his walking back to 7 days per week for weight loss reasons. Plan: 1.  Since muscle enzyme test relatively stable, will restart Livalo today at 2 mg (1/2 of 4 mg pill) 2.  Will recheck a muscle enzyme test here in 3 weeks (12/25/13) - lab, don't have to fast. 3.  Try to get back to walking at least 5 days per week for weight control. 4.  Recheck cholesterol, liver, and muscle enzyme test in 3 months as well (02/25/14 - fasting labs, show up anytime after 7:30 am); and see Alfonse RasJeremy Gleb Mcguire two days later on 02/27/14 9:00 am.

## 2013-12-05 NOTE — Progress Notes (Signed)
Patient is a pleasant 33 y.o. Male of asian [place] descent referred to lipid clinic by Dr. Delton SeeNelson.  He is here today after stopping Livalo 4 weeks ago due to possible shoulder pain when he went to urgent care.  His CK was checked after he had been off Livalo for 2 weeks, and it was 417 U/L.  Patient was concerned as this was higher than the 7-232 reference range, but we didn't have a baseline CK for patient before statins were started.  He came in yesterday for lipid / liver / CK now that he had been off Livalo for 4 weeks, and CK level was 300 U/L.  Patient may have a baseline CK well above the normal 7-232, which is quite common.  His LDL is back up to 138 mg/dL (was 95 mg/dL) now that off Livalo.  He states his should pain did not seem to improve on or off statin.  He had EKG and x-ray at urgent care 4 weeks ago which were all normal.  He tells me that we went to urgent care again yesterday as he felt a muscle twinge on his left side, but once again, all tests were normal.  Patient states his anxiety issues continue.  Livalo 2 mg qd was well controlled prior to this episode.  He was unable to tolerate Crestor 5 mg qd (body aches) and lipitor 40 mg (myalgias) in the past.  He who moved to [place] from [place] in 02/2012 by himself, then his wife moved in 2014.  Patient stated he ate horribly for the first year he lived in the [place], eating junk food, pizza, fast food a lot.  Now that his wife is here he is eating better.  He saw his blood work results from 07/2013 and given his elevated LDL and LDL-P number he got "scared into eating better".  He is now eating a healthy low fat diet, and avoiding fatty, greasy, or fast food.  He was walking 4 miles per day, 7 days per week, however since his cholesterol looked so good last month, he stopped walking as much on his own.Marland Kitchen.   He does have an ongoing issue with anxiety which he takes medication for.  He has tried to cut down on his cigarette smoking since  seeing blood work, but can't stop completely yet, as this is a crutch for his stress at work.  Smoking 6-7 cigarettes per day currently.  He use to be a heavy drinker (alcohol) as well, however hasn't had any alcohol over past 6 months.    RF:  Family h/o CAD (father had PCI at 32 y.o.), HTN, low HDL- LDL goal at least < 130, prefer < 100;  LDL-P number goal at least < 1300 Meds:  Off Livalo for past month Intolerant:  Lipitor 40 mg qd (muscle/joint aches), Crestor 5 mg qd (muscle aches)  Family history:  Father had PCI at 32 y.o.  Has a younger brother with no health problems. Social history:  Smoked since he was 33 years old.  Has cut back to 6-7 cigarettes per day, but given stress of his job, will be hard to quit altogether.  Stopped drinking alcohol 6 months ago. Diet:  Excellent now.  Breakfast is cereal and almonds.  Lunch he comes home for now - vegetable and lentil based pita wrap.  Uses olive oil and fresh vegetables for all meals.  Dinner is typically vegetables as well.  He sometimes eats cereal at night if he  gets home late.  Drinks 1 glass of green tea during the day.  Doesn't drink soda anymore (use to drink multiple sodas per day up until a few months ago).  Desserts - eats a small [place] dessert twice per week (use to do this 7 times per week).   Exercise:  Now walking 2-3 miles just 3 days per week.  Was walking 4 miles 7 days per week until recently.  Plans on increasing to 7 days again.  Labs: 12/04/2013:  TC 216, TG 228, HDL 32, LDL 138, CK 300 (off Livalo x 4 weeks) 11/21/13:  CK 417 (off livalo 2 weeks prior to this lab) 10/2013:  LDL-P number, 1595, LDL 95, TC 160, TG 156, HDL 34, LFTs normal (Livalo 2 mg qd x 2 weeks and much improved diet) 09/2013:  LDL-P 2005, TC 187 (improved diet) 07/2013:  Lp(a) 18, LDL-P number 2289, LDL 152, HDL 32, TG 188, TC 222, TSH normal, LFTs normal, renal fxn normal - Not on lipid lowering meds.  Current Outpatient Prescriptions  Medication Sig  Dispense Refill  . ALPRAZolam (XANAX) 0.25 MG tablet Take 0.25 mg by mouth 2 (two) times daily as needed for anxiety.      . meloxicam (MOBIC) 15 MG tablet Take 1 tablet (15 mg total) by mouth daily.  15 tablet  0  . methocarbamol (ROBAXIN) 500 MG tablet Take 1 tablet (500 mg total) by mouth 3 (three) times daily.  30 tablet  0  . pantoprazole (PROTONIX) 40 MG tablet Take 1 tablet (40 mg total) by mouth daily.  30 tablet  11  . PARoxetine (PAXIL-CR) 12.5 MG 24 hr tablet Take 12.5 mg by mouth 2 (two) times daily.      Marland Kitchen. PRESCRIPTION MEDICATION Take 1 tablet by mouth every Sunday. *rx from another country, 60000 units of chewable vitamin d once a week*      . PRESCRIPTION MEDICATION Take 6.25 mg by mouth at bedtime. *rx from another country, nexipride(levosulpiride) 25mg *      . propranolol (INDERAL) 10 MG tablet Take 10 mg by mouth at bedtime.      . Pitavastatin Calcium (LIVALO) 4 MG TABS Take 0.5 tablets (2 mg total) by mouth daily.  30 tablet     No current facility-administered medications for this visit.   Allergies  Allergen Reactions  . Crestor [Rosuvastatin]     Severe muscle aches all over body on Crestor 5 mg qd  . Lipitor [Atorvastatin]     Joint and muscle aches on lipitor 40 mg qd   Family History  Problem Relation Age of Onset  . Heart Problems Father 5856    PCI-stent

## 2013-12-09 ENCOUNTER — Other Ambulatory Visit: Payer: BC Managed Care – PPO

## 2013-12-25 ENCOUNTER — Other Ambulatory Visit (INDEPENDENT_AMBULATORY_CARE_PROVIDER_SITE_OTHER): Payer: BC Managed Care – PPO

## 2013-12-25 DIAGNOSIS — Z79899 Other long term (current) drug therapy: Secondary | ICD-10-CM

## 2013-12-25 DIAGNOSIS — E785 Hyperlipidemia, unspecified: Secondary | ICD-10-CM

## 2013-12-25 LAB — LIPID PANEL
Cholesterol: 152 mg/dL (ref 0–200)
HDL: 31.6 mg/dL — ABNORMAL LOW (ref >39.00)
LDL Cholesterol: 82 mg/dL (ref 0–99)
NonHDL: 120.4
Total CHOL/HDL Ratio: 5
Triglycerides: 191 mg/dL — ABNORMAL HIGH (ref 0.0–149.0)
VLDL: 38.2 mg/dL (ref 0.0–40.0)

## 2013-12-25 LAB — HEPATIC FUNCTION PANEL
ALT: 39 U/L (ref 0–53)
AST: 32 U/L (ref 0–37)
Albumin: 4.3 g/dL (ref 3.5–5.2)
Alkaline Phosphatase: 71 U/L (ref 39–117)
Bilirubin, Direct: 0 mg/dL (ref 0.0–0.3)
Total Bilirubin: 0.7 mg/dL (ref 0.2–1.2)
Total Protein: 7.2 g/dL (ref 6.0–8.3)

## 2013-12-25 LAB — CK: Total CK: 514 U/L — ABNORMAL HIGH (ref 7–232)

## 2013-12-27 ENCOUNTER — Telehealth: Payer: Self-pay | Admitting: Pharmacist

## 2013-12-27 DIAGNOSIS — Z79899 Other long term (current) drug therapy: Secondary | ICD-10-CM

## 2013-12-27 NOTE — Telephone Encounter (Signed)
Called and spoke with patient about recent lab work (LFTs / lipid panel / CPK).  He has a baseline CK of ~ 300, and CK was was 400 after being off statin for 2 weeks in the past, therefore it appears his baseline CK is somewhere b/t 300-400 most likely.  He has been on Livalo 2 mg qd for the past 3 weeks.  His LDL is now < 100, LFTs normal, but his CK level is up to 514.  Spoke with patient and he tells me that he has had a little more muscle aches since being on Livalo, but it hasn't been significant.  The muscle discomfort has mostly been his back and legs.    I informed patient to d/c Livalo 2 mg qd for now given CK trending up and having mild muscle aches.  Will recheck CK level in 4 weeks, and based on his CK level and muscle symptoms will decide what to do with therapy at that time.  To Dr. Delton SeeNelson as Lorain ChildesFYI only.

## 2014-01-29 ENCOUNTER — Other Ambulatory Visit (INDEPENDENT_AMBULATORY_CARE_PROVIDER_SITE_OTHER): Payer: BC Managed Care – PPO

## 2014-01-29 DIAGNOSIS — Z79899 Other long term (current) drug therapy: Secondary | ICD-10-CM

## 2014-01-29 LAB — CK: Total CK: 280 U/L — ABNORMAL HIGH (ref 7–232)

## 2014-02-03 ENCOUNTER — Encounter: Payer: Self-pay | Admitting: Pharmacist

## 2014-02-03 ENCOUNTER — Other Ambulatory Visit: Payer: Self-pay | Admitting: Pharmacist

## 2014-02-03 DIAGNOSIS — Z79899 Other long term (current) drug therapy: Secondary | ICD-10-CM

## 2014-02-03 DIAGNOSIS — E785 Hyperlipidemia, unspecified: Secondary | ICD-10-CM

## 2014-02-06 ENCOUNTER — Encounter: Payer: Self-pay | Admitting: Pharmacist

## 2014-02-10 ENCOUNTER — Other Ambulatory Visit: Payer: Self-pay | Admitting: Pharmacist

## 2014-02-10 DIAGNOSIS — E785 Hyperlipidemia, unspecified: Secondary | ICD-10-CM

## 2014-02-18 ENCOUNTER — Other Ambulatory Visit (INDEPENDENT_AMBULATORY_CARE_PROVIDER_SITE_OTHER): Payer: BC Managed Care – PPO

## 2014-02-18 DIAGNOSIS — E785 Hyperlipidemia, unspecified: Secondary | ICD-10-CM

## 2014-02-18 DIAGNOSIS — Z79899 Other long term (current) drug therapy: Secondary | ICD-10-CM

## 2014-02-18 LAB — HEPATIC FUNCTION PANEL
ALT: 38 U/L (ref 0–53)
AST: 29 U/L (ref 0–37)
Albumin: 4.2 g/dL (ref 3.5–5.2)
Alkaline Phosphatase: 66 U/L (ref 39–117)
Bilirubin, Direct: 0 mg/dL (ref 0.0–0.3)
Total Bilirubin: 0.6 mg/dL (ref 0.2–1.2)
Total Protein: 7.4 g/dL (ref 6.0–8.3)

## 2014-02-18 LAB — CK: Total CK: 344 U/L — ABNORMAL HIGH (ref 7–232)

## 2014-02-20 ENCOUNTER — Ambulatory Visit (INDEPENDENT_AMBULATORY_CARE_PROVIDER_SITE_OTHER): Payer: BC Managed Care – PPO | Admitting: Pharmacist

## 2014-02-20 VITALS — Wt 196.0 lb

## 2014-02-20 DIAGNOSIS — Z79899 Other long term (current) drug therapy: Secondary | ICD-10-CM

## 2014-02-20 DIAGNOSIS — E785 Hyperlipidemia, unspecified: Secondary | ICD-10-CM

## 2014-02-20 LAB — NMR LIPOPROFILE WITH LIPIDS
Cholesterol, Total: 198 mg/dL (ref 100–199)
HDL Particle Number: 24.5 umol/L — ABNORMAL LOW (ref >=30.5)
HDL Size: 8.5 nm — ABNORMAL LOW (ref >=9.2)
HDL-C: 31 mg/dL — ABNORMAL LOW (ref >39)
LDL (calc): 124 mg/dL — ABNORMAL HIGH (ref 0–99)
LDL Particle Number: 1660 nmol/L — ABNORMAL HIGH (ref <1000)
LDL Size: 20.5 nm (ref >=20.8)
LP-IR Score: 90 — ABNORMAL HIGH (ref <=45)
Large HDL-P: 1.3 umol/L — ABNORMAL LOW (ref >=4.8)
Large VLDL-P: 11.4 nmol/L — ABNORMAL HIGH (ref <=2.7)
Small LDL Particle Number: 764 nmol/L — ABNORMAL HIGH (ref <=527)
Triglycerides: 217 mg/dL — ABNORMAL HIGH (ref 0–149)
VLDL Size: 60.2 nm — ABNORMAL HIGH (ref <=46.6)

## 2014-02-20 NOTE — Patient Instructions (Signed)
1.  Stay off cholesterol medicine for now. 2.  Continue walking most days of the week and riding bike to walk. 3.  Recheck blood work (NMR / Liver/ muscle) in 4  Months in (06/24/14 - fasting labs - show up anytime after 7:30 am), and see Riki Rusk 2 days later on 06/26/14 at 11:00 am  [phone]

## 2014-02-20 NOTE — Progress Notes (Signed)
Patient is a pleasant 33 y.o. Male of asian [place] descent here for follow up in lipid clinic.  He discontinued Livalo 2 mg two months ago due to CK of 514 U/L (no symptoms of muscle aches).  He had been on and off statin, and didn't have a great baseline CK while off statin for a prolonged period of time. His CK this week was 344 U/L off statin for 2 months. His baseline looking back appears to be a CK of 300-400 U/L.   Patient was concerned as this was higher than the 7-232 reference range, but after explaining that his baseline was much higher than the "normal reference range" he felt better. His LDL is back up to 124 mg/dL and LDL-P number 4098 since being off Livalo. Six months ago his LDL-P was 2300 and LDL was 152 mg/dL however after improving his diet and exercise regimen he has been able to significantly lower cholesterol without meds.  He previously was going to urgent care with anxiety and muscle pains which he feels may have been manifested from anxiety, however over the past 2 months since being off statin, he has not had any pain or anxiety, and therefore hasn't been going to Urgent Care.  He was unable to tolerate Crestor 5 mg qd (body aches) and lipitor 40 mg (myalgias) in the past.  Tolerated Livalo 2 mg qd but CK went up to 514 U/L which is only a mild elevation given baseline CK is ~ 300-400 in patient.  He who moved to [place] from [place] in 02/2012 by himself, then his wife moved in 2014.  Patient stated he ate horribly for the first year he lived in the [place], eating junk food, pizza, fast food a lot.  Now that his wife is here he is eating better.  He saw his blood work results from 07/2013 and given his elevated LDL and LDL-P number he got "scared into eating better".  He is now eating a healthy low fat diet, and avoiding fatty, greasy, or fast food.  He was walking 4 miles per day, 7 days per week, and rides his bike to work daily (1 mile). Hehas tried to cut down on his cigarette  smoking since seeing blood work, but can't stop completely yet, as this is a crutch for his stress at work.  Smoking 3-4 cigarettes per day currently.  He use to be a heavy drinker (alcohol) as well, however hasn't had any alcohol over past 6 months.    RF:  Family h/o CAD (father had PCI at 83 y.o.), HTN, low HDL- LDL goal at least < 130, prefer < 100;  LDL-P number goal at least < 1300 Meds:  Off Livalo for two months due to mild CK elevation  Intolerant:  Lipitor 40 mg qd (muscle/joint aches), Crestor 5 mg qd (muscle aches)  Family history:  Father had PCI at 41 y.o.  Has a younger brother with no health problems. Social history:  Smoked since he was 33 years old.  Has cut back to 3-4 cigarettes per day, but given stress of his job, will be hard to quit altogether.  Stopped drinking alcohol 6 months ago. Diet:  Excellent now.  Breakfast is cereal and almonds.  Lunch he comes home for now - vegetable and lentil based pita wrap.  Uses olive oil and fresh vegetables for all meals.  Dinner is typically vegetables as well.  He sometimes eats cereal at night if he gets home late.  Drinks 1 glass of green tea during the day.  Doesn't drink soda anymore (use to drink multiple sodas per day up until a few months ago).  Desserts - eats a small [place] dessert twice per week (use to do this 7 times per week).   Exercise:  Now walking 4 miles 4 days per week.  Rides his bike to work daily (1 mile).    Labs: 02/18/14:  LDL-P number 1660, LDL 124, HDL 31, TG 217, CK 344, LFTs normal (off livalo 2 mg for 2 months) 01/29/14:  CK 280 (off livalo 4 weeks) 12/25/13:  TC 152, TG 191, HDL 32, LDL 82, CK 514 (Livalo 2 mg qd - stopped today) 12/04/2013:  TC 216, TG 228, HDL 32, LDL 138, CK 300 (off Livalo x 4 weeks) 11/21/13:  CK 417 (off livalo 2 weeks prior to this lab) 10/2013:  LDL-P number, 1595, LDL 95, TC 160, TG 156, HDL 34, LFTs normal (Livalo 2 mg qd x 2 weeks and much improved diet) 09/2013:  LDL-P 2005, TC 187  (improved diet) 07/2013:  Lp(a) 18, LDL-P number 2289, LDL 152, HDL 32, TG 188, TC 222, TSH normal, LFTs normal, renal fxn normal - Not on lipid lowering meds.  Current Outpatient Prescriptions  Medication Sig Dispense Refill  . ALPRAZolam (XANAX) 0.25 MG tablet Take 0.25 mg by mouth 2 (two) times daily as needed for anxiety.      . meloxicam (MOBIC) 15 MG tablet Take 1 tablet (15 mg total) by mouth daily.  15 tablet  0  . methocarbamol (ROBAXIN) 500 MG tablet Take 1 tablet (500 mg total) by mouth 3 (three) times daily.  30 tablet  0  . pantoprazole (PROTONIX) 40 MG tablet Take 1 tablet (40 mg total) by mouth daily.  30 tablet  11  . PARoxetine (PAXIL-CR) 12.5 MG 24 hr tablet Take 12.5 mg by mouth 2 (two) times daily.      Marland Kitchen PRESCRIPTION MEDICATION Take 1 tablet by mouth every Sunday. *rx from another country, 60000 units of chewable vitamin d once a week*      . PRESCRIPTION MEDICATION Take 6.25 mg by mouth at bedtime. *rx from another country, nexipride(levosulpiride) *      . propranolol (INDERAL) 10 MG tablet Take 10 mg by mouth at bedtime.       No current facility-administered medications for this visit.   Allergies  Allergen Reactions  . Crestor [Rosuvastatin]     Severe muscle aches all over body on Crestor 5 mg qd  . Lipitor [Atorvastatin]     Joint and muscle aches on lipitor 40 mg qd   Family History  Problem Relation Age of Onset  . Heart Problems Father 62    PCI-stent

## 2014-02-20 NOTE — Assessment & Plan Note (Signed)
Patient is being managed for primary prevention, and he has shown that he has been able to reduce cholesterol significantly with lifestyle changes and weight loss.  Livalo 1-2 mg daily still reasonable option given only a mild CK elevation and tolerated medication well, however given he hasn't had any anxiety or muscle aches in months, will have patient remain off statin and reassess NMR / CK / LFTs next year.  If he develops chest pains and starts become concerned about potential heart disease again before then, we could also initiate low dose Livalo again.  Patient agreeable to use no lipid lowering therapy at this time, but to continue his healthy lifestyle and re-evaluate 06/2014.

## 2014-02-21 ENCOUNTER — Encounter: Payer: Self-pay | Admitting: Pharmacist

## 2014-02-24 ENCOUNTER — Encounter: Payer: Self-pay | Admitting: Cardiology

## 2014-02-25 ENCOUNTER — Other Ambulatory Visit: Payer: BC Managed Care – PPO

## 2014-02-27 ENCOUNTER — Ambulatory Visit: Payer: BC Managed Care – PPO | Admitting: Pharmacist

## 2014-06-24 ENCOUNTER — Other Ambulatory Visit (INDEPENDENT_AMBULATORY_CARE_PROVIDER_SITE_OTHER): Payer: BLUE CROSS/BLUE SHIELD | Admitting: *Deleted

## 2014-06-24 DIAGNOSIS — E785 Hyperlipidemia, unspecified: Secondary | ICD-10-CM

## 2014-06-24 DIAGNOSIS — Z79899 Other long term (current) drug therapy: Secondary | ICD-10-CM

## 2014-06-24 LAB — HEPATIC FUNCTION PANEL
ALT: 42 U/L (ref 0–53)
AST: 30 U/L (ref 0–37)
Albumin: 4.4 g/dL (ref 3.5–5.2)
Alkaline Phosphatase: 71 U/L (ref 39–117)
Bilirubin, Direct: 0.1 mg/dL (ref 0.0–0.3)
Total Bilirubin: 1 mg/dL (ref 0.2–1.2)
Total Protein: 7.2 g/dL (ref 6.0–8.3)

## 2014-06-24 LAB — CK: Total CK: 557 U/L — ABNORMAL HIGH (ref 7–232)

## 2014-06-26 ENCOUNTER — Ambulatory Visit: Payer: BC Managed Care – PPO | Admitting: Pharmacist

## 2014-06-27 LAB — NMR LIPOPROFILE WITH LIPIDS
Cholesterol, Total: 184 mg/dL (ref 100–199)
HDL Particle Number: 21.7 umol/L — ABNORMAL LOW (ref >=30.5)
HDL Size: 8.4 nm — ABNORMAL LOW (ref >=9.2)
HDL-C: 33 mg/dL — ABNORMAL LOW (ref >39)
LDL (calc): 125 mg/dL — ABNORMAL HIGH (ref 0–99)
LDL Particle Number: 1262 nmol/L — ABNORMAL HIGH (ref <1000)
LDL Size: 20.9 nm (ref >=20.8)
LP-IR Score: 51 — ABNORMAL HIGH (ref <=45)
Large HDL-P: <1.3 umol/L — ABNORMAL LOW (ref >=4.8)
Large VLDL-P: 1.4 nmol/L (ref <=2.7)
Small LDL Particle Number: 340 nmol/L (ref <=527)
Triglycerides: 128 mg/dL (ref 0–149)
VLDL Size: 40.1 nm (ref <=46.6)

## 2014-06-30 ENCOUNTER — Encounter: Payer: Self-pay | Admitting: Pharmacist

## 2014-06-30 ENCOUNTER — Ambulatory Visit (INDEPENDENT_AMBULATORY_CARE_PROVIDER_SITE_OTHER): Payer: BLUE CROSS/BLUE SHIELD | Admitting: Pharmacist

## 2014-06-30 VITALS — Wt 195.8 lb

## 2014-06-30 DIAGNOSIS — E785 Hyperlipidemia, unspecified: Secondary | ICD-10-CM | POA: Diagnosis not present

## 2014-06-30 NOTE — Progress Notes (Signed)
S/O: Patient is a pleasant 34yo male of asian [place] descent, referred by Dr. Delton SeeNelson, here for follow up in lipid clinic. Pt has a PMH significant for HTN, tobacco use, chest pain, palpitations, and hyperlipidemia. Pt appears to have a consistently elevated baseline CK (300-400 U/L).   Pt is intolerant to multiple statins. He was unable to tolerate Crestor 5 mg qd (body aches) and lipitor 40 mg (myalgias) in the past.  Tolerated Livalo 2 mg qd but CK went up to 514 U/L which is only a mild elevation given baseline CK is ~ 300-400 in patient.   RF:  Family h/o CAD (father had PCI at 33yo), HTN, low HDL   Goal: LDL at least < 130, prefer < 100;  LDL-P number goal at least < 1300  Family history:  Father had PCI at 33 y.o.  Has a younger brother with no health problems. Social history:  Smoked since he was 34 years old.  Has cut back to 3-4 cigarettes per day, but given stress of his job, will be hard to quit altogether.  No current alcohol use.  Diet:  Excellent now.  Breakfast is cereal, nuts, berries/fruit.  Lunch he comes home for now - vegetable and lentil based whole-wheat pita wrap.  Uses olive oil and fresh vegetables for all meals.  Dinner is typically vegetables as well.  Primarily drinks water now, decreased tea consumption. Doesn't drink soda anymore (use to drink multiple sodas per day up until a few months ago).  Desserts - eats a small [place] dessert twice per week (use to do this 7 times per week).   Pt indicates that he sometimes eats dinner late at night (around 10:00 PM) and has been trying to decrease late night eating - reports some indigestion/heartburn when eating late. Exercise:  Now walking at least 5x/week, rides his bike to work daily (1 mile), now lifting weights at the gym several days each week  Labs: 06/24/14: LDL-P number 1262, LDL 125, HDL 33, TG 128, CK 557, LFTs WNL (no current cholesterol medications) 02/18/14:  LDL-P number 1660, LDL 124, HDL 31, TG 217, CK 344, LFTs  normal (off livalo 2 mg for 2 months) 01/29/14:  CK 280 (off livalo 4 weeks) 12/25/13:  TC 152, TG 191, HDL 32, LDL 82, CK 514 (Livalo 2 mg qd - stopped today) 12/04/2013:  TC 216, TG 228, HDL 32, LDL 138, CK 300 (off Livalo x 4 weeks) 11/21/13:  CK 417 (off livalo 2 weeks prior to this lab) 10/2013:  LDL-P number, 1595, LDL 95, TC 160, TG 156, HDL 34, LFTs normal (Livalo 2 mg qd x 2 weeks and much improved diet) 09/2013:  LDL-P 2005, TC 187 (improved diet) 07/2013:  Lp(a) 18, LDL-P number 2289, LDL 152, HDL 32, TG 188, TC 222, TSH normal, LFTs normal, renal fxn normal - Not on lipid lowering meds.  Current Outpatient Prescriptions on File Prior to Visit  Medication Sig Dispense Refill  . ALPRAZolam (XANAX) 0.25 MG tablet Take 0.25 mg by mouth 2 (two) times daily as needed for anxiety.    . meloxicam (MOBIC) 15 MG tablet Take 1 tablet (15 mg total) by mouth daily. 15 tablet 0  . methocarbamol (ROBAXIN) 500 MG tablet Take 1 tablet (500 mg total) by mouth 3 (three) times daily. 30 tablet 0  . pantoprazole (PROTONIX) 40 MG tablet Take 1 tablet (40 mg total) by mouth daily. 30 tablet 11  . PARoxetine (PAXIL-CR) 12.5 MG 24 hr tablet Take  12.5 mg by mouth 2 (two) times daily.    Marland Kitchen PRESCRIPTION MEDICATION Take 1 tablet by mouth every Sunday. *rx from another country, 60000 units of chewable vitamin d once a week*    . PRESCRIPTION MEDICATION Take 6.25 mg by mouth at bedtime. *rx from another country, nexipride(levosulpiride) *    . propranolol (INDERAL) 10 MG tablet Take 10 mg by mouth at bedtime.     No current facility-administered medications on file prior to visit.    Allergies  Allergen Reactions  . Crestor [Rosuvastatin]     Severe muscle aches all over body on Crestor 5 mg qd  . Lipitor [Atorvastatin]     Joint and muscle aches on lipitor 40 mg qd   Family History  Problem Relation Age of Onset  . Heart Problems Father 32    PCI-stent     A/P: Pt is primary prevention. Pt appears to  be motivated to improve his overall health. Indicates that he is exercising 5 days/week at the gym in addition to biking to work. Patient indicates that he has cut out unhealthy food choices such as pizza and fried foods and increased his intake of lean meats (chicken) and vegetables.  Pt is currently at LDL goal of <130 (124 on 06/24/14) and LDL-P goal of <1300 (1262 on 06/24/14).  He has seen improvement in lipid panel with lifestyle modifications alone, no cholesterol-lowering meds at this time.  Pt indicated that he was open to the idea of smoking cessation but that he was not ready to set a quit date. Provided Pt with informational handouts on the benefits of smoking cessation and recommended contacting 1-800-QUIT-NOW for additional cessation resources.  Encouraged patient to continue exercising and making healthy dietary choices. No need for follow up with Lipid Clinic at this time as patient has achieved goal lipid levels without medication. Continue care with PCP and Cardiologist.  If further lipid lowering is required in the future, may consider Livalo  daily given only a mild CK elevation and tolerated medication well previously.  May also consider low-dose Crestor 1-2 days/week.  Written by Alyson Reedy, PharmD Candidate  Waynette Buttery, PharmD Clinical Pharmacy Resident

## 2014-06-30 NOTE — Patient Instructions (Signed)
It was nice to meet you today.  Your cholesterol numbers are looking great just based on the increased exercise and improved diet. Continue to exercise and get high fiber foods Good luck on quitting smoking - you can do it!  We no longer need to see you in lipid clinic.  Your cardiologist and primary care doctor can continue to monitor your cholesterol.

## 2014-09-11 ENCOUNTER — Emergency Department (HOSPITAL_COMMUNITY)
Admission: EM | Admit: 2014-09-11 | Discharge: 2014-09-11 | Disposition: A | Discharge status: Home / Self Care | Payer: BLUE CROSS/BLUE SHIELD | Source: Home / Self Care | Attending: Family Medicine | Admitting: Family Medicine

## 2014-09-11 ENCOUNTER — Encounter (HOSPITAL_COMMUNITY): Payer: Self-pay | Admitting: Emergency Medicine

## 2014-09-11 DIAGNOSIS — F411 Generalized anxiety disorder: Secondary | ICD-10-CM

## 2014-09-11 MED ORDER — CLONAZEPAM 0.5 MG PO TABS: 0.5000 mg | ORAL_TABLET | Freq: Two times a day (BID) | ORAL | Status: DC | PRN

## 2014-09-11 NOTE — ED Provider Notes (Signed)
CSN: [id number]     Arrival date & time 09/11/14  1947 History   First MD Initiated Contact with Patient 09/11/14 2023     Chief Complaint  Patient presents with  . Anxiety   (Consider location/radiation/quality/duration/timing/severity/associated sxs/prior Treatment) Patient is a 34 y.o. male presenting with anxiety. The history is provided by the patient.  Anxiety This is a new problem. The current episode started yesterday (onset after increasing duloxetine med to 60 mg  for anxiety issues,, has been on paxil for long time prev.). The problem has not changed since onset.Associated symptoms include chest pain. Pertinent negatives include no abdominal pain.    Past Medical History  Diagnosis Date  . Anxiety   . Antral gastritis     history of  . Unspecified essential hypertension   . Family history of coronary arteriosclerosis 06/17/2013  . Palpitations 06/17/2013   History reviewed. No pertinent past surgical history. Family History  Problem Relation Age of Onset  . Heart Problems Father 23    PCI-stent    History  Substance Use Topics  . Smoking status: Current Every Day Smoker -- 11 years    Types: Cigarettes  . Smokeless tobacco: Not on file  . Alcohol Use: Yes    Review of Systems  Constitutional: Negative.   Respiratory: Negative.   Cardiovascular: Positive for chest pain. Negative for palpitations and leg swelling.  Gastrointestinal: Negative for nausea, vomiting and abdominal pain.  Musculoskeletal: Negative.   Psychiatric/Behavioral: The patient is nervous/anxious.     Allergies  Crestor and Lipitor  Home Medications   Prior to Admission medications   Medication Sig Start Date End Date Taking? Authorizing Provider  DULOXETINE HCL PO Take by mouth.   Yes Historical Provider, MD  ALPRAZolam (XANAX) 0.25 MG tablet Take 0.25 mg by mouth 2 (two) times daily as needed for anxiety.    Historical Provider, MD  clonazePAM (KLONOPIN) 0.5 MG tablet Take 1 tablet (0.5  mg total) by mouth 2 (two) times daily as needed for anxiety. 09/11/14   Linna Hoff, MD  meloxicam (MOBIC) 15 MG tablet Take 1 tablet (15 mg total) by mouth daily. 12/04/13   Reuben Likes, MD  methocarbamol (ROBAXIN) 500 MG tablet Take 1 tablet (500 mg total) by mouth 3 (three) times daily. 12/04/13   Reuben Likes, MD  pantoprazole (PROTONIX) 40 MG tablet Take 1 tablet (40 mg total) by mouth daily. 10/29/13   Lars Masson, MD  PARoxetine (PAXIL-CR) 12.5 MG 24 hr tablet Take 12.5 mg by mouth 2 (two) times daily.    Historical Provider, MD  PRESCRIPTION MEDICATION Take 1 tablet by mouth every Sunday. *rx from another country, 60000 units of chewable vitamin d once a week*    Historical Provider, MD  PRESCRIPTION MEDICATION Take 6.25 mg by mouth at bedtime. *rx from another country, nexipride(levosulpiride) *    Historical Provider, MD  propranolol (INDERAL) 10 MG tablet Take 10 mg by mouth at bedtime.    Historical Provider, MD   BP 138/89 mmHg  Pulse 87  Temp(Src) 98.4 F (36.9 C) (Oral)  Resp 16  SpO2 98% Physical Exam  Constitutional: He is oriented to person, place, and time. He appears well-developed and well-nourished.  Eyes: Pupils are equal, round, and reactive to light.  Neck: Normal range of motion. Neck supple.  Cardiovascular: Normal rate, regular rhythm, normal heart sounds and intact distal pulses.   Lymphadenopathy:    He has no cervical adenopathy.  Neurological: He is  alert and oriented to person, place, and time.  Skin: Skin is warm and dry.  Psychiatric: His speech is normal and behavior is normal. Thought content normal. His mood appears anxious.  Nursing note and vitals reviewed.   ED Course  Procedures (including critical care time) Labs Review Labs Reviewed - No data to display  Imaging Review No results found.   MDM   1. GAD (generalized anxiety disorder)        Linna HoffJames D Kindl, MD 09/11/14 2040

## 2014-09-11 NOTE — Discharge Instructions (Signed)
Use medicine as prescribed, see your doctor next week for further eval.

## 2014-09-11 NOTE — ED Notes (Signed)
C/o anxiety, sharp, pin like needles in chest.  Reports pcp recently changed anxiety medicine.  Symptoms started yesterday.  [place] is [place] of [place], patient nervous.

## 2015-02-02 IMAGING — CR DG CHEST 2V
2 series · 2 of 2 positions shown · non-contrast
Comparison: May 06, 2013

CLINICAL DATA: Chest pain with shortness of breath

EXAM:
CHEST  2 VIEW

[w chest pa]
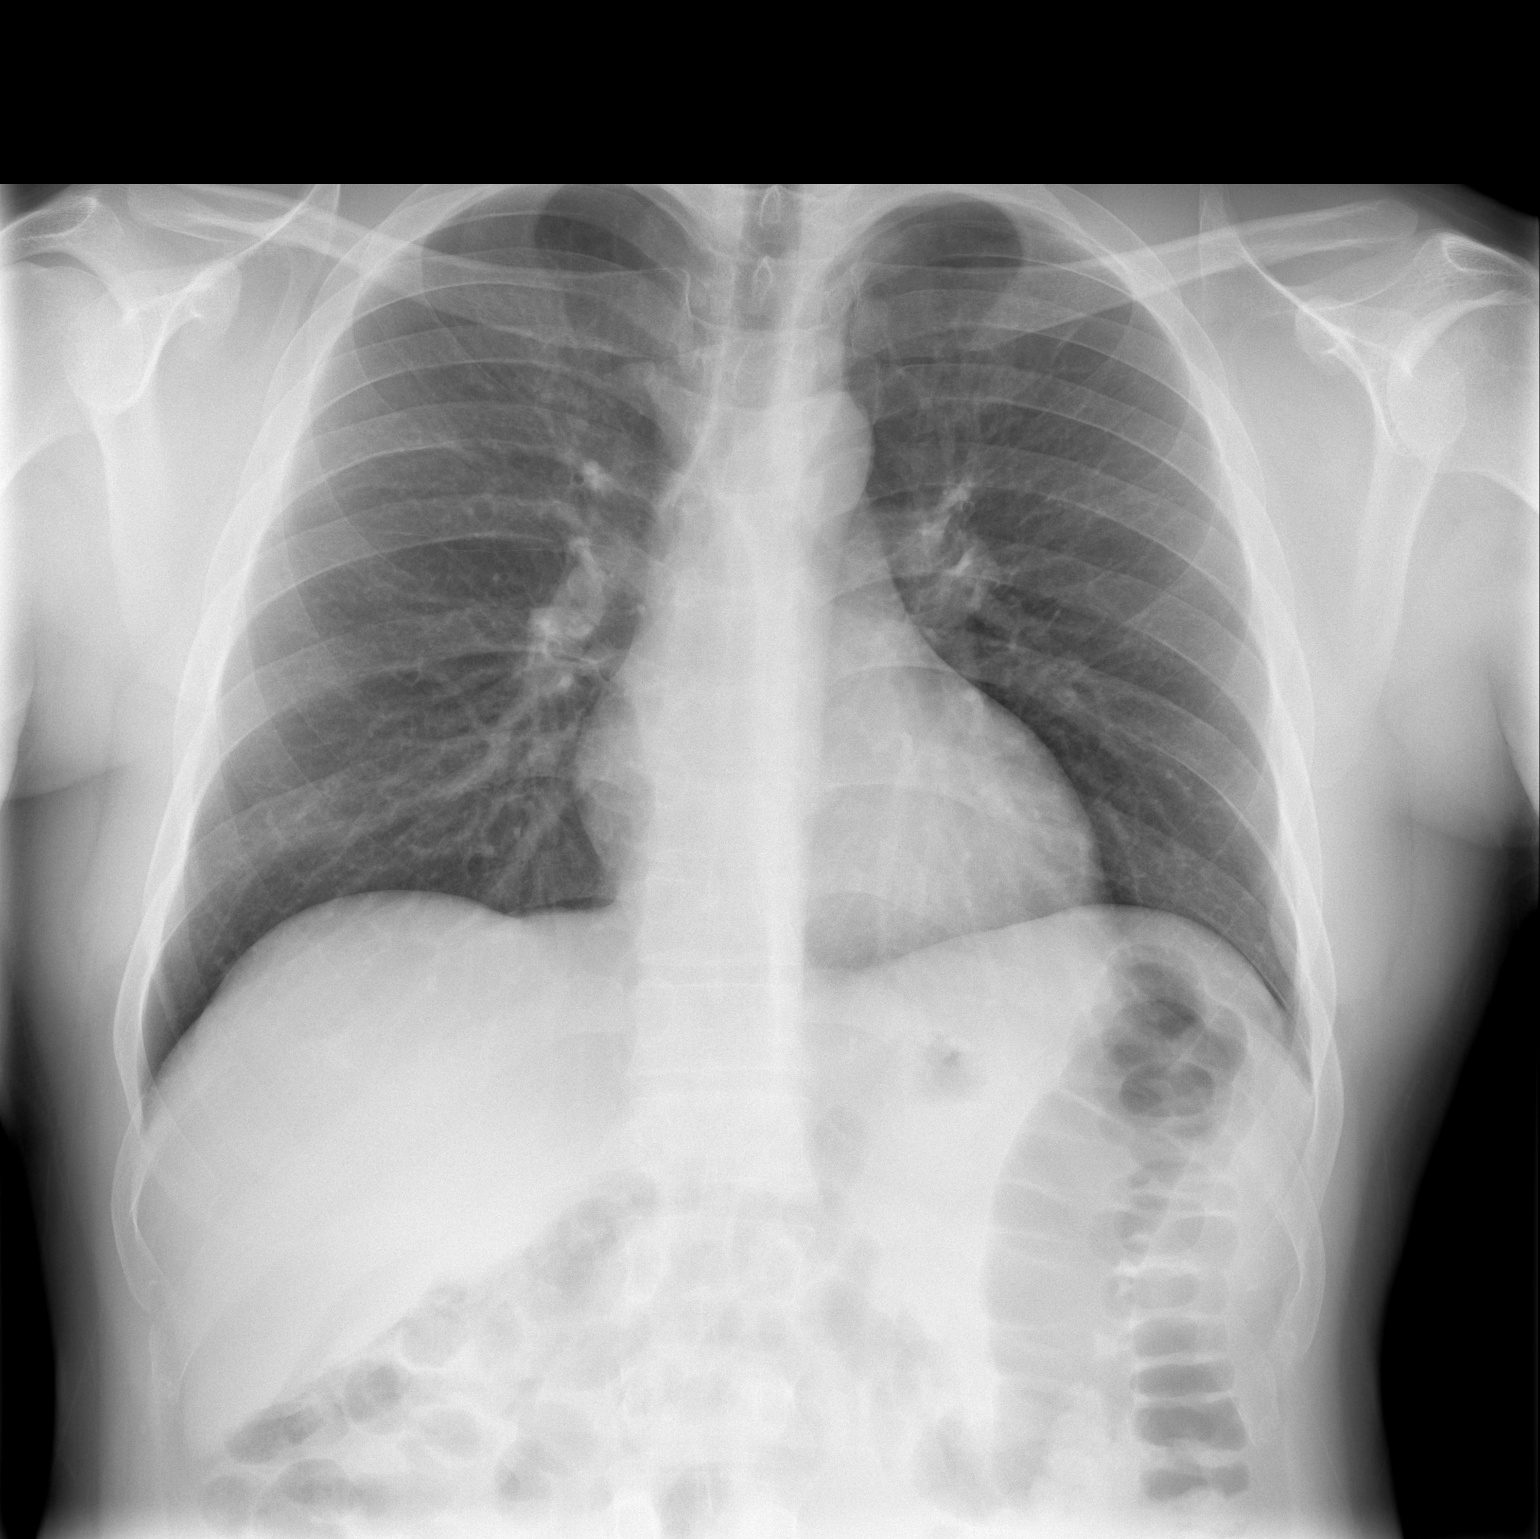

[w chest lat]
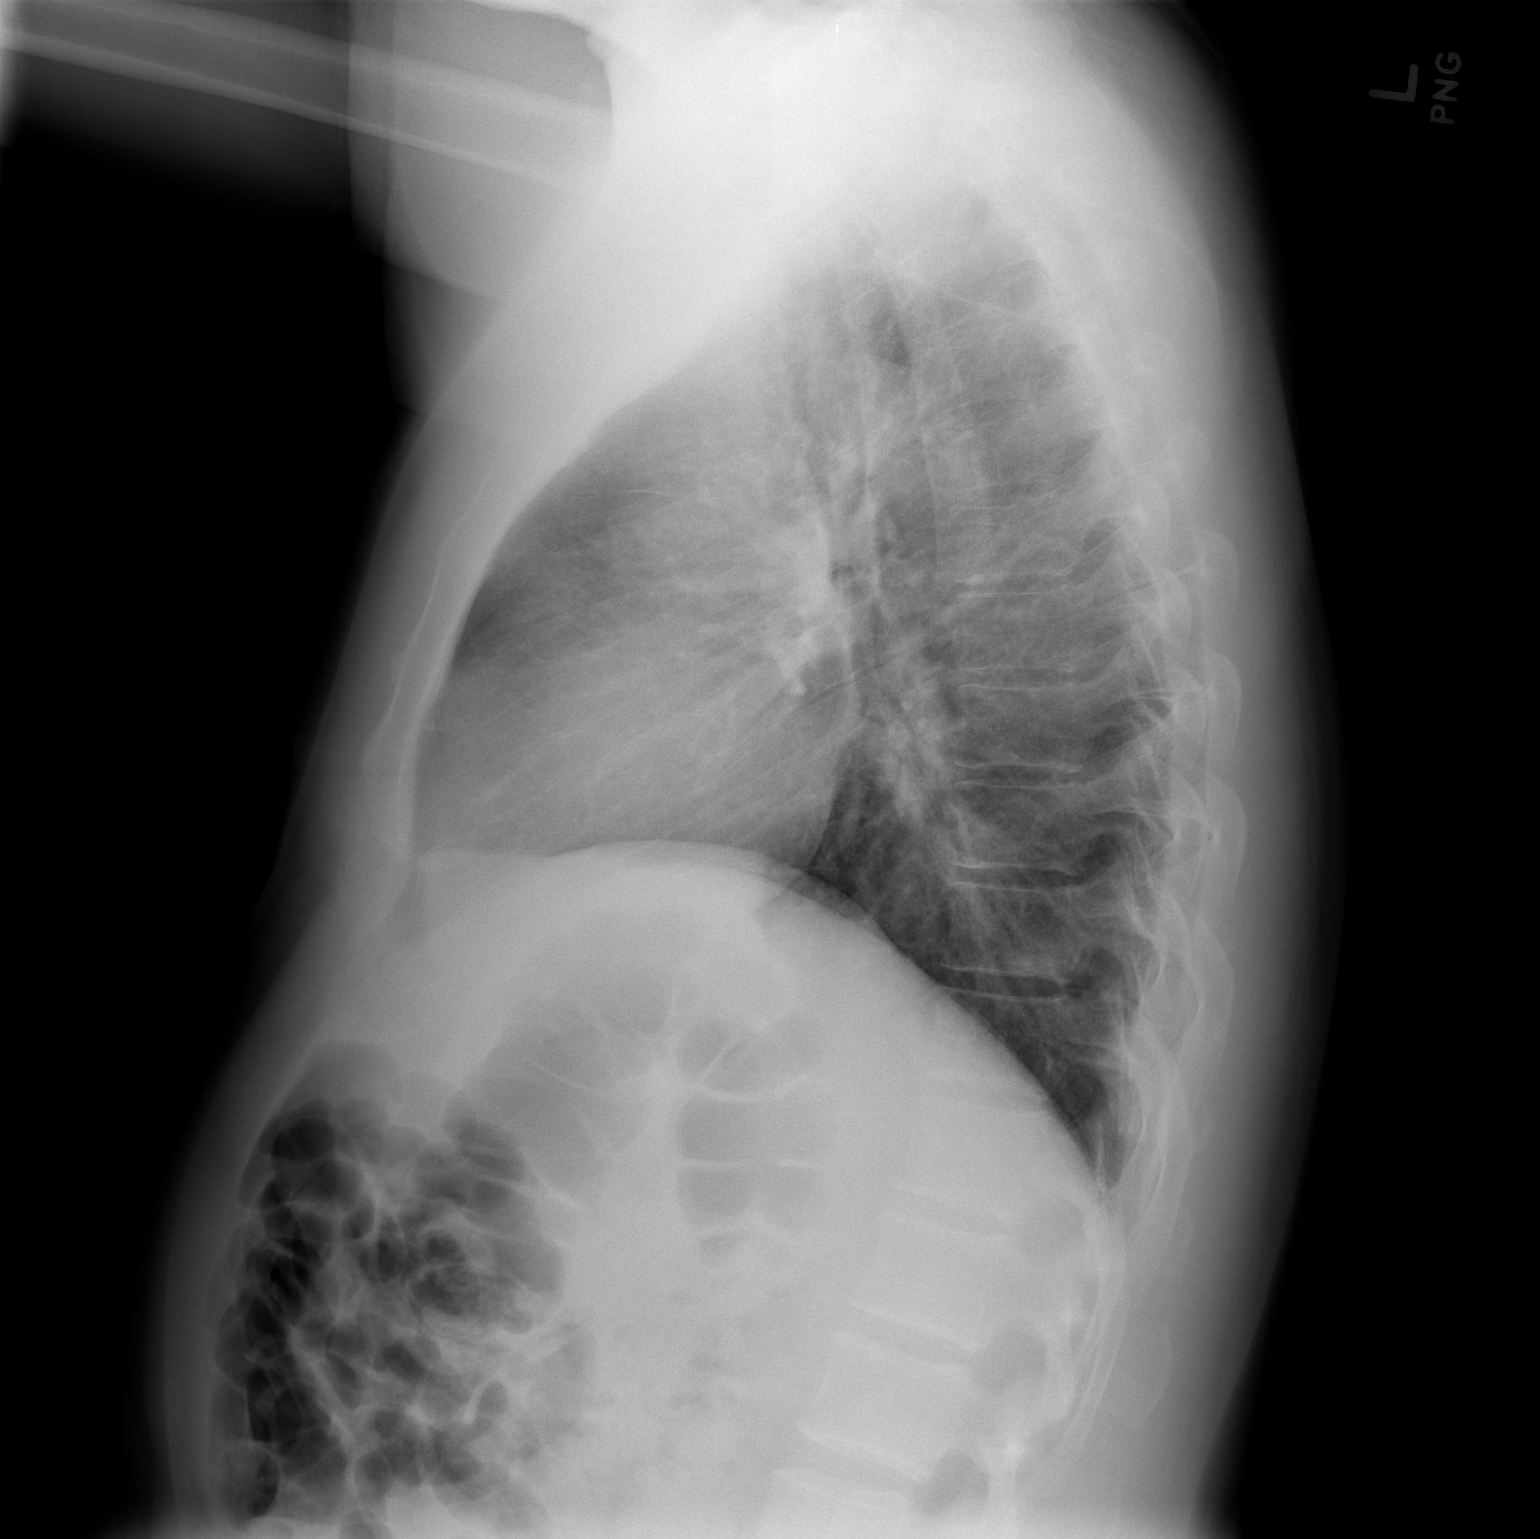

[2 of 2 positions shown; findings below may reference images not displayed]

FINDINGS: The lungs are clear. The heart size and pulmonary vascularity are
normal. No pneumothorax. No adenopathy. No bone lesions.
IMPRESSION: No abnormality noted.

## 2015-04-04 IMAGING — US US ABDOMEN COMPLETE
1 series · 14 of 25 positions shown · non-contrast
Comparison: None.

CLINICAL DATA: Abdominal pain.

EXAM:
ULTRASOUND ABDOMEN COMPLETE

[Series 1: us abdomen complete · 0.26mm/px · 14 of 75 slices shown]
[im 1/75]
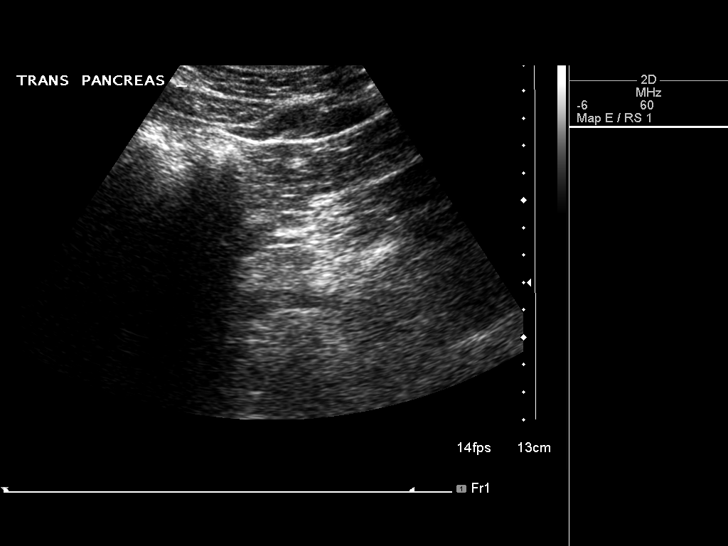
[im 7/75]
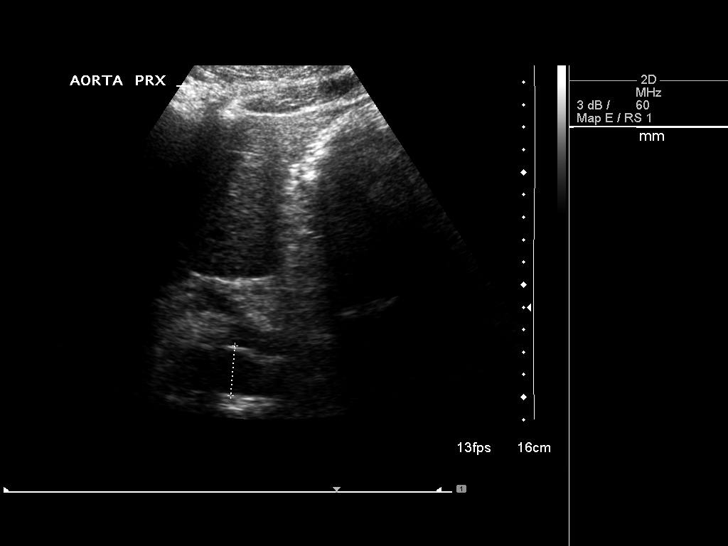
[im 13/75]
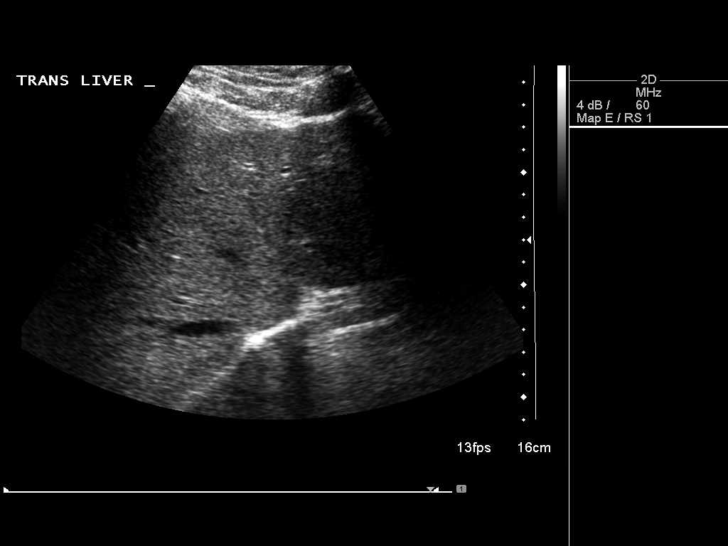
[im 19/75]
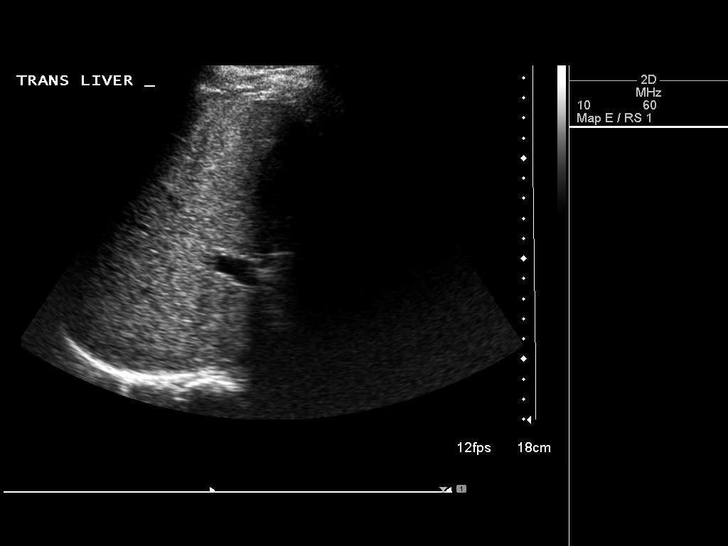
[im 25/75]
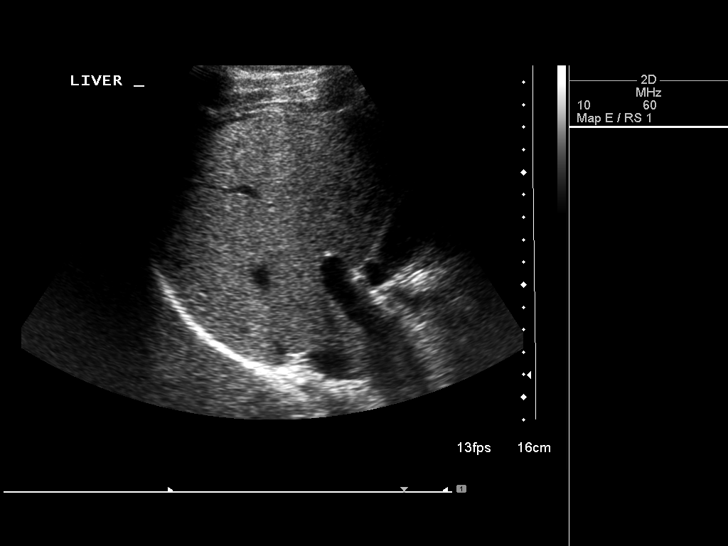
[im 28/75]
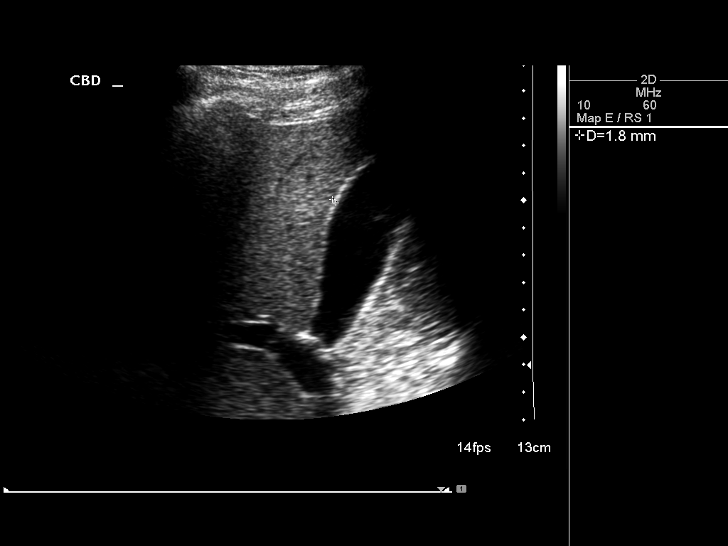
[im 34/75]
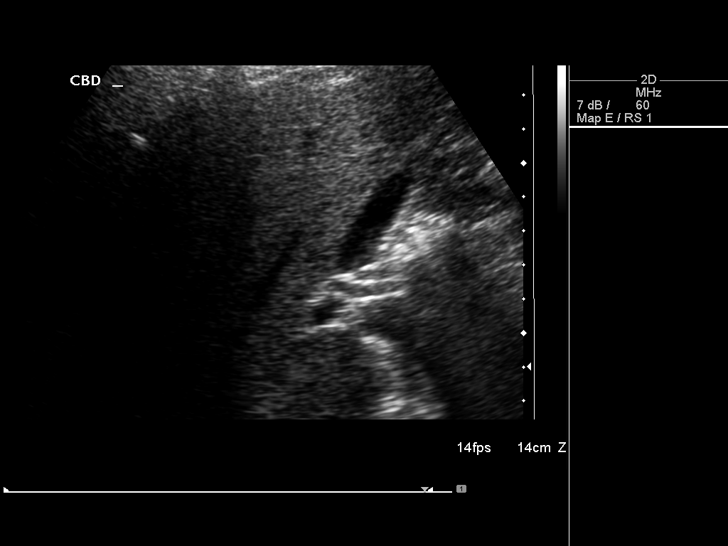
[im 41/75]
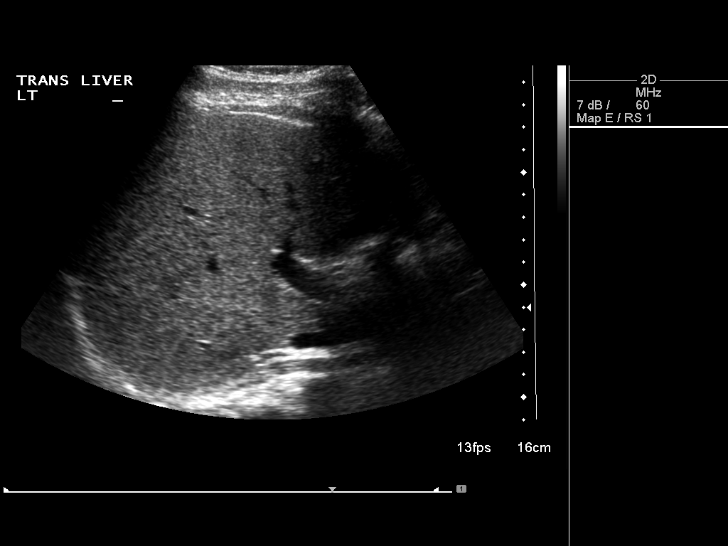
[im 47/75]
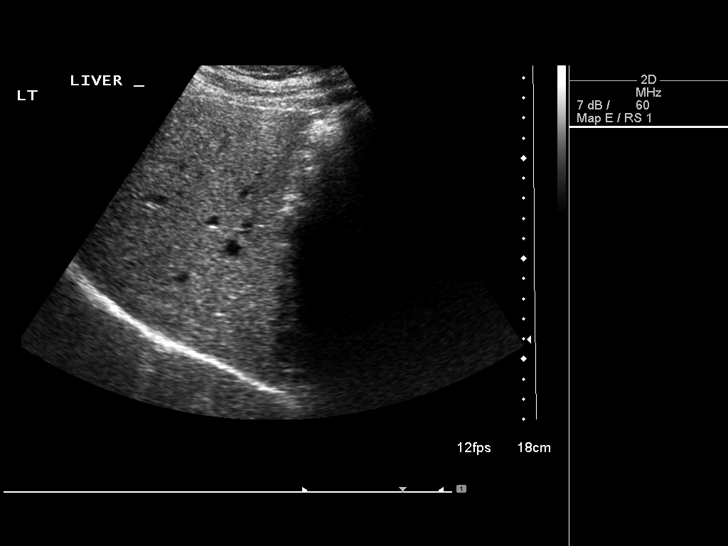
[im 50/75]
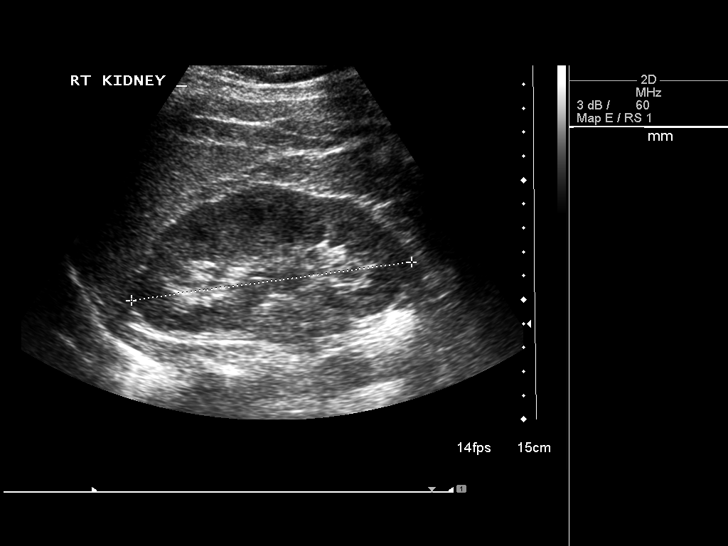
[im 56/75]
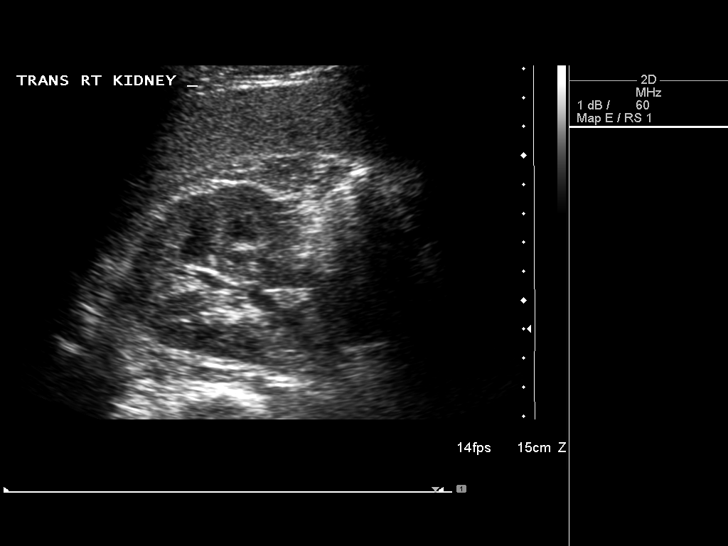
[im 62/75]
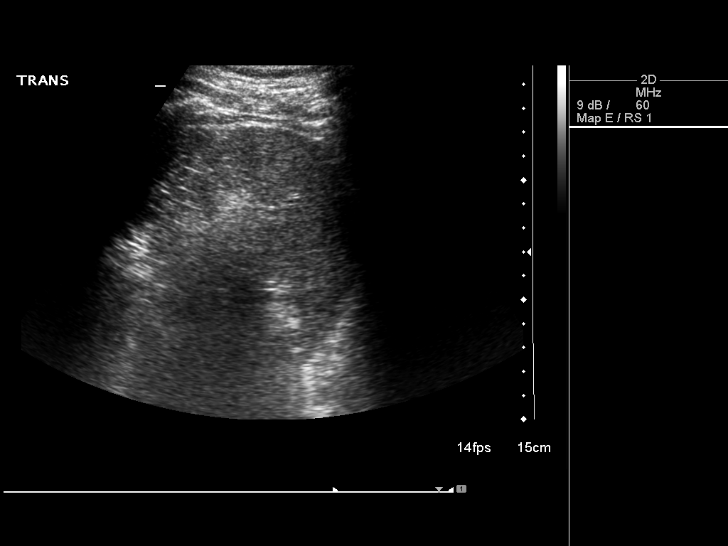
[im 68/75]
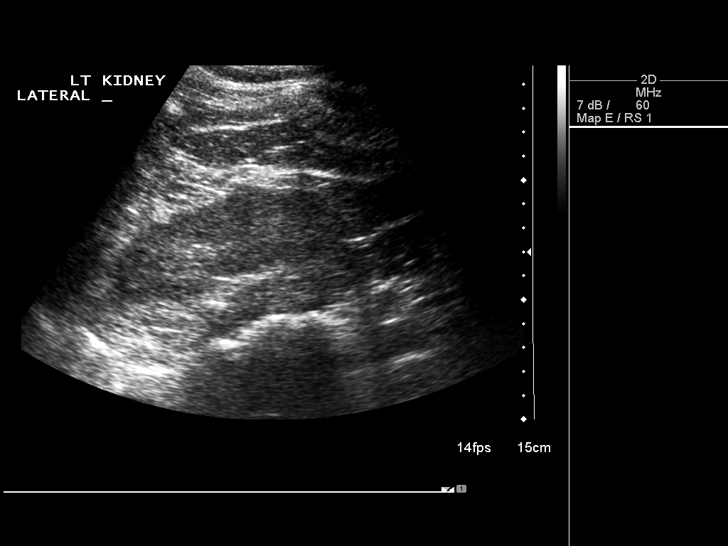
[im 75/75]
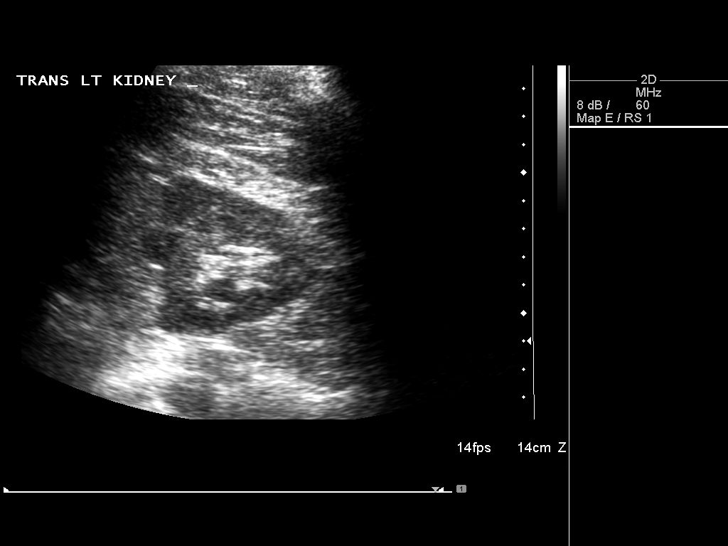

[14 of 25 positions shown; findings below may reference images not displayed]

FINDINGS: Gallbladder:

No gallstones or wall thickening visualized. No sonographic Murphy
sign noted.

Common bile duct:

Diameter: Normal caliber, 4 mm

Liver:

No focal lesion identified. Within normal limits in parenchymal
echogenicity.

IVC:

No abnormality visualized.

Pancreas:

Visualized portion unremarkable.

Spleen:

Size and appearance within normal limits.

Right Kidney:

Length: 11.8 cm. Echogenicity within normal limits. No mass or
hydronephrosis visualized.

Left Kidney:

Length: 12.3 cm. Echogenicity within normal limits. No mass or
hydronephrosis visualized.

Abdominal aorta:

No aneurysm visualized.

Other findings:

None.
IMPRESSION: Unremarkable abdominal ultrasound.

## 2015-04-09 IMAGING — CR DG CHEST 2V
2 series · 2 of 2 positions shown · non-contrast
Comparison: Chest x-ray 09/29/2013.

CLINICAL DATA: Chest pain.

EXAM:
CHEST  2 VIEW

[view not recorded (1 of 2)]
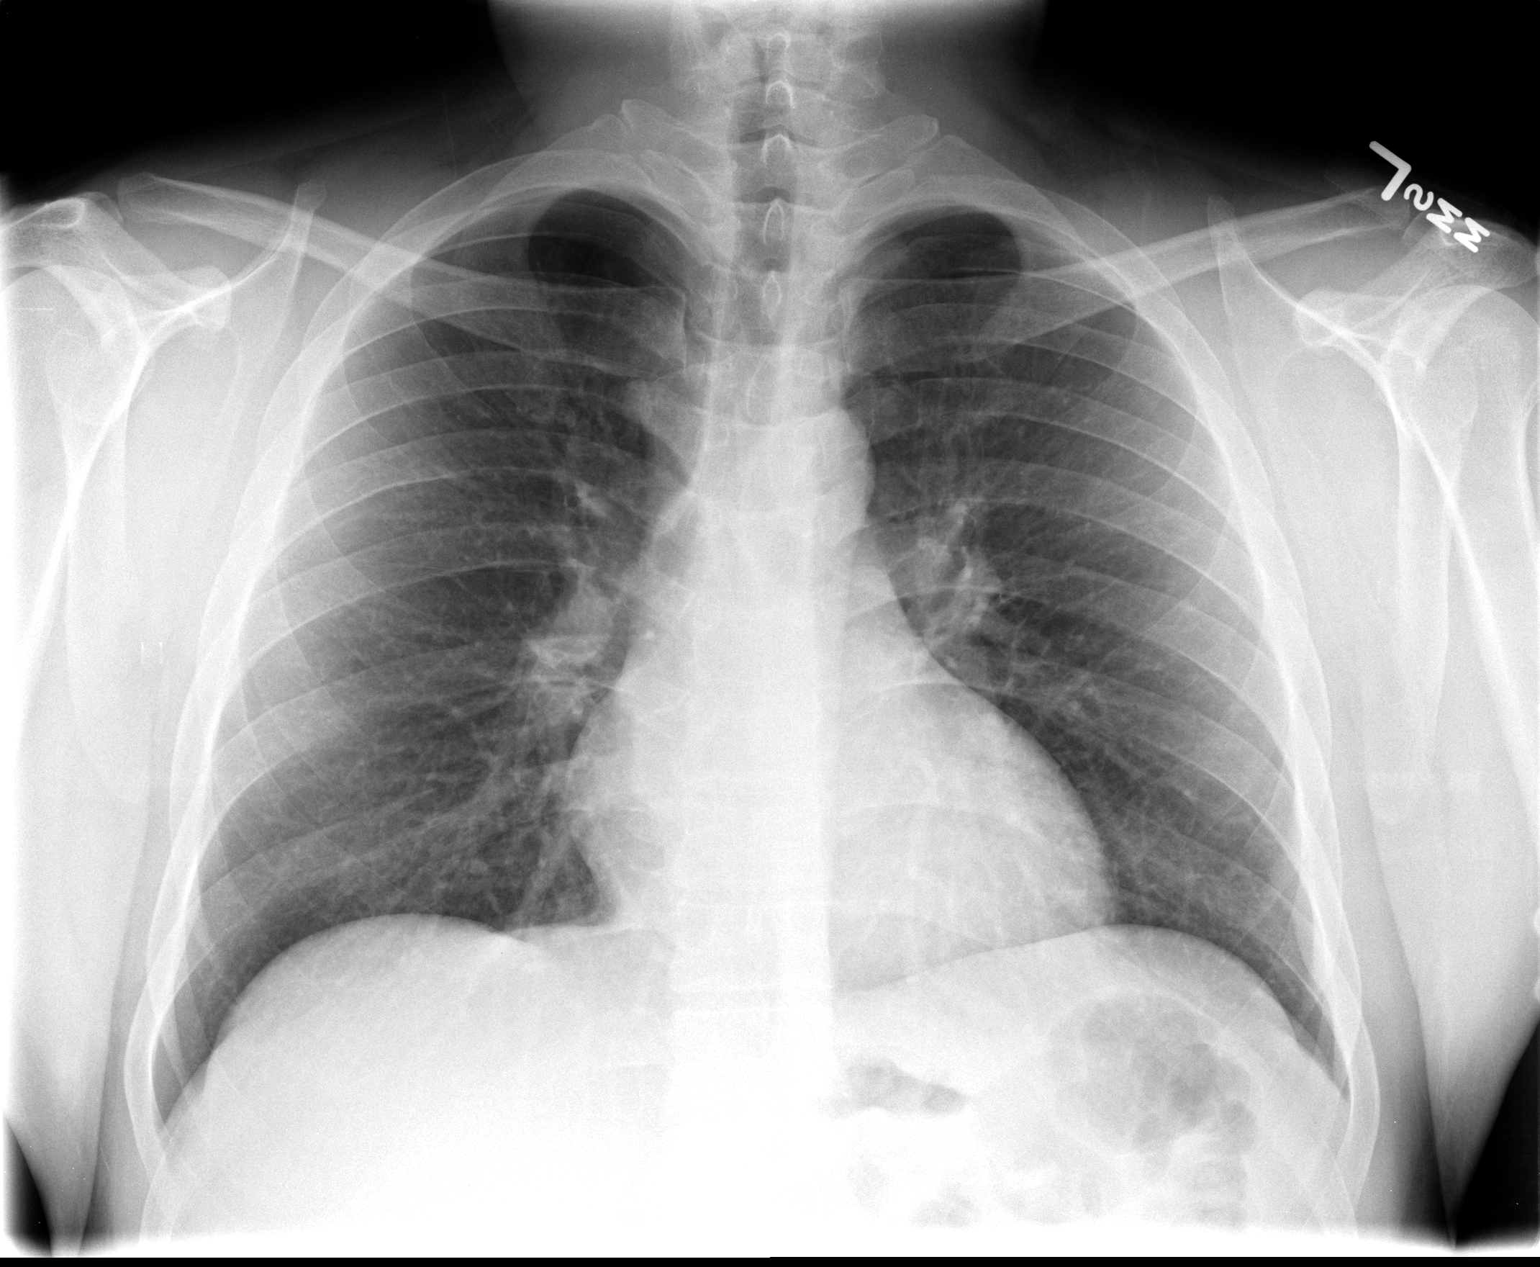

[view not recorded (2 of 2)]
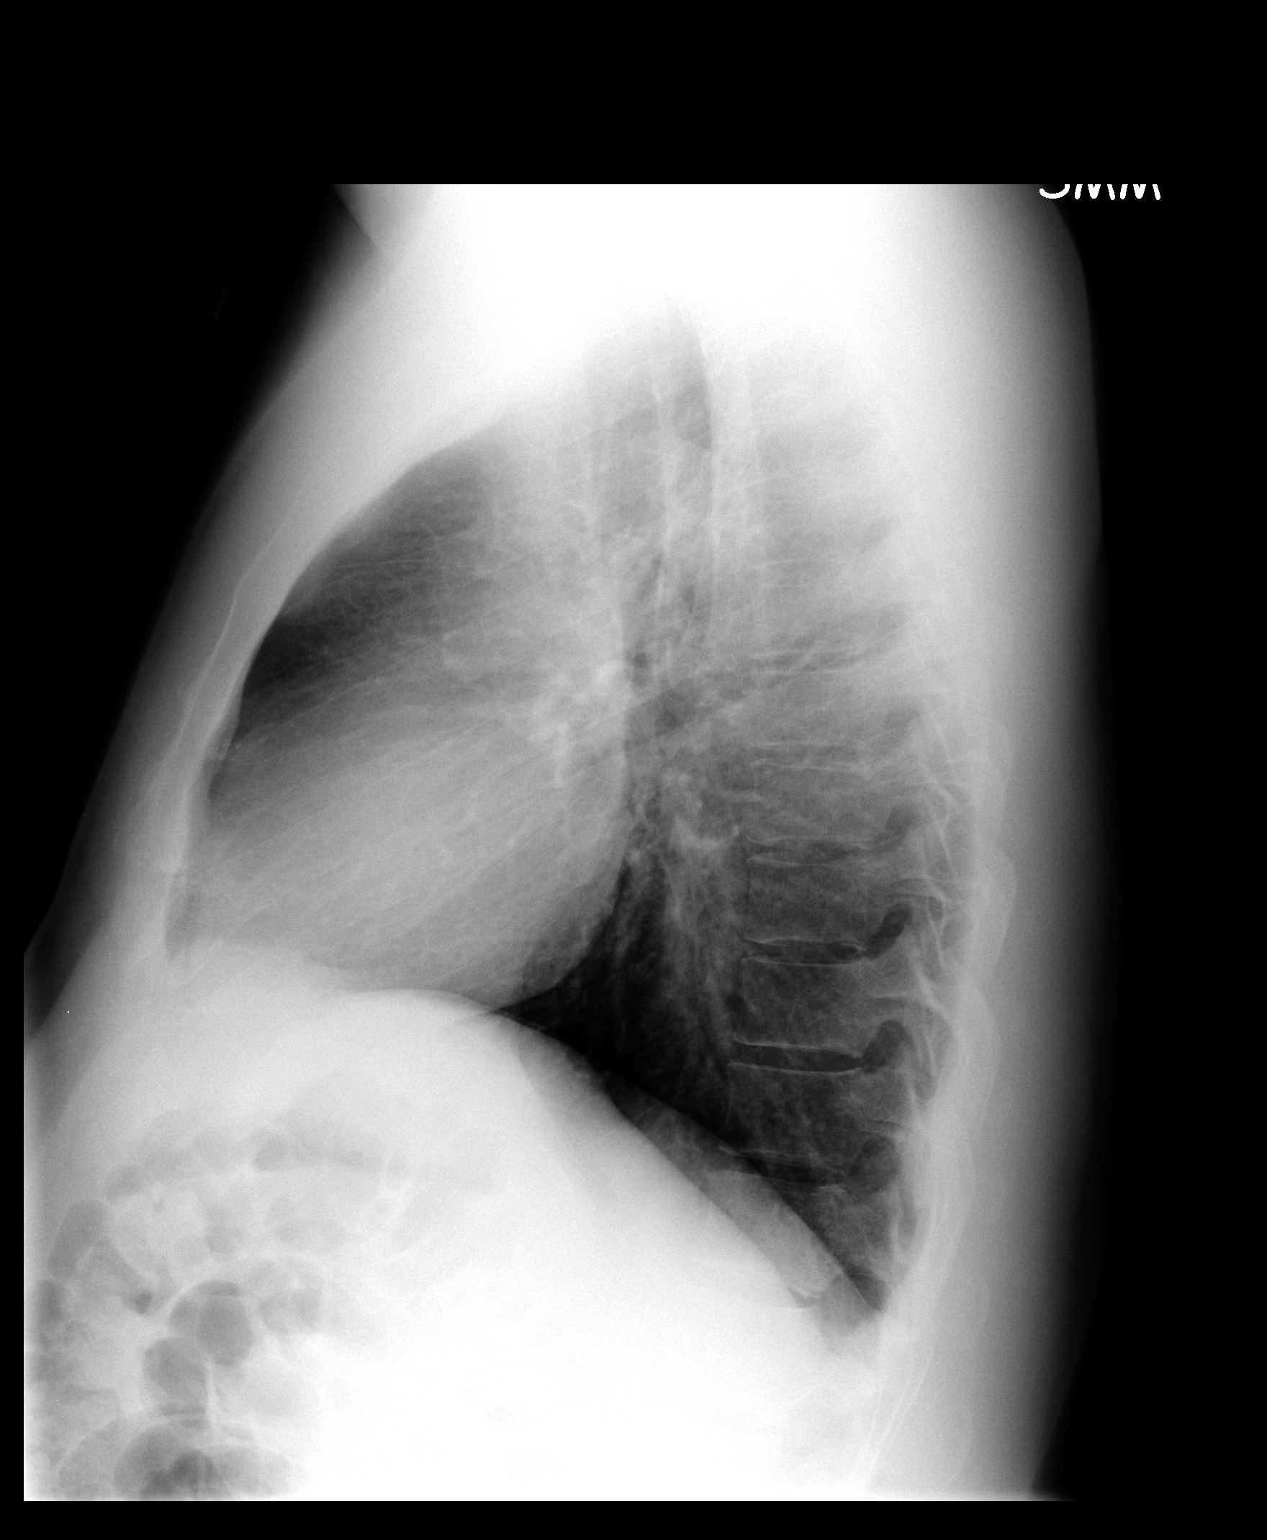

[2 of 2 positions shown; findings below may reference images not displayed]

FINDINGS: Mediastinum and hilar structures normal the lungs are clear. Heart
size normal. No pleural effusion or pneumothorax. No acute bony
abnormality identified. Tiny metallic density noted over the right
chest wall.
IMPRESSION: No active cardiopulmonary disease.

## 2017-02-13 ENCOUNTER — Ambulatory Visit (HOSPITAL_COMMUNITY)
Admission: EM | Admit: 2017-02-13 | Discharge: 2017-02-13 | Disposition: A | Discharge status: Home / Self Care | Payer: BLUE CROSS/BLUE SHIELD | Attending: Physician Assistant | Admitting: Physician Assistant

## 2017-02-13 ENCOUNTER — Encounter (HOSPITAL_COMMUNITY): Payer: Self-pay | Admitting: *Deleted

## 2017-02-13 DIAGNOSIS — M25512 Pain in left shoulder: Secondary | ICD-10-CM | POA: Diagnosis not present

## 2017-02-13 DIAGNOSIS — T148XXA Other injury of unspecified body region, initial encounter: Secondary | ICD-10-CM | POA: Diagnosis not present

## 2017-02-13 DIAGNOSIS — M549 Dorsalgia, unspecified: Secondary | ICD-10-CM | POA: Diagnosis not present

## 2017-02-13 MED ORDER — NAPROXEN 500 MG PO TABS: 500.0000 mg | ORAL_TABLET | Freq: Two times a day (BID) | ORAL | 0 refills | Status: AC

## 2017-02-13 NOTE — ED Provider Notes (Signed)
[facility]    CSN: [id number] Arrival date & time: 02/13/17  1212     History   Chief Complaint Chief Complaint  Patient presents with  . Back Pain    HPI Clarence Armstrong is a 36 y.o. male.   36 year old male with history of anxiety, hyperlipidemia comes in for 2 day history of left neck, shoulder pain that radiates to the back, flank, left upper quadrant abdomen. States he thought it was a muscle strain at first, but woke up today with worsening pain, and wanted evaluation. He states he does not have pain without movement, pain worse with cough, sneezing, laughing. Denies nausea, vomiting, diarrhea, constipation. Denies urinary symptoms such as frequency, dysuria, hematuria. Patient states he has history of anxiety, and does have shortness of breath at times, which improves with walking. Denies chest pain, palpitation, wheezing, dyspnea on exertion. Denies current shortness of breath. Family history of CAD, with father having stent placed in around age 35.       Past Medical History:  Diagnosis Date  . Antral gastritis    history of  . Anxiety   . Family history of coronary arteriosclerosis 06/17/2013  . Palpitations 06/17/2013  . Unspecified essential hypertension     Patient Active Problem List   Diagnosis Date Noted  . Hyperlipemia 09/19/2013  . Chest pain 06/17/2013  . Family history of coronary arteriosclerosis 06/17/2013  . Essential hypertension 06/17/2013  . Palpitations 06/17/2013    History reviewed. No pertinent surgical history.     Home Medications    Prior to Admission medications   Medication Sig Start Date End Date Taking? Authorizing Provider  ALPRAZolam (XANAX) 0.25 MG tablet Take 0.25 mg by mouth 2 (two) times daily as needed for anxiety.    [provider]  clonazePAM (KLONOPIN) 0.5 MG tablet Take 1 tablet (0.5 mg total) by mouth 2 (two) times daily as needed for anxiety. 09/11/14   Linna HoffKindl, James D, MD  DULOXETINE HCL PO Take by  mouth.    [provider]  meloxicam (MOBIC) 15 MG tablet Take 1 tablet (15 mg total) by mouth daily. 12/04/13   Reuben LikesKeller, David C, MD  methocarbamol (ROBAXIN) 500 MG tablet Take 1 tablet (500 mg total) by mouth 3 (three) times daily. 12/04/13   Reuben LikesKeller, David C, MD  naproxen (NAPROSYN) 500 MG tablet Take 1 tablet (500 mg total) by mouth 2 (two) times daily. 02/13/17 02/23/17  Cathie HoopsYu, Azriella Mattia V, PA-C  pantoprazole (PROTONIX) 40 MG tablet Take 1 tablet (40 mg total) by mouth daily. 10/29/13   Lars MassonNelson, Katarina H, MD  PARoxetine (PAXIL-CR) 12.5 MG 24 hr tablet Take 12.5 mg by mouth 2 (two) times daily.    [provider]  PRESCRIPTION MEDICATION Take 1 tablet by mouth every Sunday. *[place] 60000 units of chewable vitamin d once a week*    [provider]  PRESCRIPTION MEDICATION Take 6.25 mg by mouth at bedtime. *rx [place], nexipride(levosulpiride) 25mg *    [provider]  propranolol (INDERAL) 10 MG tablet Take 10 mg by mouth at bedtime.    [provider]    Family History Family History  Problem Relation Age of Onset  . Heart Problems Father [id number]       PCI-stent     Social History Social History  Substance Use Topics  . Smoking status: Current Every Day Smoker    Years: 11.00    Types: Cigarettes  . Smokeless tobacco: Not on file  .  Alcohol use Yes     Allergies   Crestor [rosuvastatin] and Lipitor [atorvastatin]   Review of Systems Review of Systems  Reason unable to perform ROS: See HPI as above.     Physical Exam Triage Vital Signs ED Triage Vitals [02/13/17 1427]  Enc Vitals Group     BP (!) 152/106     Pulse Rate 78     Resp 18     Temp 98.6 F (37 C)     Temp Source Oral     SpO2 100 %     Weight      Height      Head Circumference      Peak Flow      Pain Score 8     Pain Loc      Pain Edu?      Excl. in GC?    No data found.   Updated Vital Signs BP (!) 152/106 (BP Location: Left Arm)    Pulse 78   Temp 98.6 F (37 C) (Oral)   Resp 18   SpO2 100%    Physical Exam  Constitutional: He is oriented to person, place, and time. He appears well-developed and well-nourished. No distress.  HENT:  Head: Normocephalic and atraumatic.  Eyes: Pupils are equal, round, and reactive to light. Conjunctivae are normal.  Neck: Normal range of motion. No spinous process tenderness and no muscular tenderness present. Normal range of motion present.  Cardiovascular: Normal rate, regular rhythm and normal heart sounds.  Exam reveals no gallop and no friction rub.   No murmur heard. Pulmonary/Chest: Effort normal and breath sounds normal. He has no wheezes. He has no rales.  Musculoskeletal:  No tenderness on palpation of the neck, shoulder, back. Full range of motion of shoulder, back. Strength normal and equal bilaterally. Strength testing for elbow elicits left rib/shoulder pain he has been experiencing. Sensation intact and equal bilaterally.  Radial pulses 2+ and equal bilaterally. Capillary refill less than 2 seconds.   Neurological: He is alert and oriented to person, place, and time.  Skin: Skin is warm and dry.     UC Treatments / Results  Labs (all labs ordered are listed, but only abnormal results are displayed) Labs Reviewed - No data to display  EKG  EKG Interpretation None       Radiology No results found.  Procedures Procedures (including critical care time)  Medications Ordered in UC Medications - No data to display   Initial Impression / Assessment and Plan / UC Course  I have reviewed the triage vital signs and the nursing notes.  Pertinent labs & imaging results that were available during my care of the patient were reviewed by me and considered in my medical decision making (see chart for details).    Discussed with patient history and exam most consistent with muscle strain. Start NSAID as directed for pain and inflammation. Ice/heat compresses.  Discussed with patient strain can take up to 3-4 weeks to resolve, but should be getting better each week. Given patient without current chest pain, shortness of breath, numbness/tingling, deferred ECG. Discussed with patient given no history of angina, dyspnea on exertion, with cardiology evaluation in 2016, low suspicion for cardiac causes. However, given significant family history of CAD at age 24, patient to follow up with PCP for any evaluation and referral as needed. Return precautions given.    Final Clinical Impressions(s) / UC Diagnoses   Final diagnoses:  Muscle strain    New  Prescriptions New Prescriptions   NAPROXEN (NAPROSYN) 500 MG TABLET    Take 1 tablet (500 mg total) by mouth 2 (two) times daily.      Belinda Fisher, PA-C 02/13/17 1526    Linward Headland V, PA-C 02/13/17 1528

## 2017-02-13 NOTE — ED Triage Notes (Signed)
Pt   Woke  Up  With  Pain  Radiating  From  l   Side  Of  Back       To  l    Flank  And    l  Side  Of  abd   Pain is   Worse on movement   And   When  He   Coughs

## 2017-02-13 NOTE — Discharge Instructions (Signed)
Your exam was most consistent with muscle strain. Start Naproxen as directed. Ice/heat compresses as needed. This can take up to 3-4 weeks to completely resolve, but you should be feeling better each week. Follow up here or with PCP if symptoms worsen, changes for reevaluation. If experiencing chest pain, shortness of breath, weakness, dizziness, go to the ED for further evaluation.

## 2019-11-06 ENCOUNTER — Ambulatory Visit (HOSPITAL_COMMUNITY)
Admission: EM | Admit: 2019-11-06 | Discharge: 2019-11-06 | Disposition: A | Discharge status: Home / Self Care | Payer: BLUE CROSS/BLUE SHIELD

## 2019-11-06 ENCOUNTER — Encounter (HOSPITAL_COMMUNITY): Payer: Self-pay

## 2019-11-06 DIAGNOSIS — K219 Gastro-esophageal reflux disease without esophagitis: Secondary | ICD-10-CM

## 2019-11-06 DIAGNOSIS — R002 Palpitations: Secondary | ICD-10-CM | POA: Diagnosis not present

## 2019-11-06 DIAGNOSIS — F411 Generalized anxiety disorder: Secondary | ICD-10-CM

## 2019-11-06 DIAGNOSIS — I491 Atrial premature depolarization: Secondary | ICD-10-CM

## 2019-11-06 MED ORDER — HYDROXYZINE HCL 25 MG PO TABS: 25.0000 mg | ORAL_TABLET | Freq: Every evening | ORAL | 0 refills | Status: AC | PRN

## 2019-11-06 NOTE — ED Triage Notes (Signed)
Pt c/o heart palpitations and badycardia at home today. Pt states he does have anxiety. Pt has non labored breathing. Skin color WNL.

## 2019-11-06 NOTE — ED Provider Notes (Signed)
Clarence Armstrong   MRN: [id number] DOB: 04-06-1981  Subjective:   Clarence Armstrong is a 39 y.o. male presenting for acute onset of palpitations and chest pressure today.  He has a history of acid reflux/gastritis and thought that that is what this was but then started checking his heart rate and fluctuated between the 50s and 60s.  Denies fever, chest pain, shortness of breath, nausea, vomiting, belly pain.  Denies history of abnormal heart rhythms, family history of arrhythmia.  Patient is currently only using amlodipine and Xanax very seldomly.  No current facility-administered medications for this encounter.  Current Outpatient Medications:  .  amLODipine (NORVASC) 5 MG tablet, Take 5 mg by mouth daily., Disp: , Rfl:  .  ALPRAZolam (XANAX) 0.25 MG tablet, Take 0.25 mg by mouth 2 (two) times daily as needed for anxiety., Disp: , Rfl:  .  propranolol (INDERAL) 10 MG tablet, Take 10 mg by mouth at bedtime., Disp: , Rfl:    Allergies  Allergen Reactions  . Crestor [Rosuvastatin]     Severe muscle aches all over body on Crestor 5 mg qd  . Lipitor [Atorvastatin]     Joint and muscle aches on lipitor 40 mg qd    Past Medical History:  Diagnosis Date  . Antral gastritis    history of  . Anxiety   . Family history of coronary arteriosclerosis 06/17/2013  . Palpitations 06/17/2013  . Unspecified essential hypertension      History reviewed. No pertinent surgical history.  Family History  Problem Relation Age of Onset  . Heart Problems Father 4       PCI-stent     Social History   Tobacco Use  . Smoking status: Current Some Day Smoker    Years: 11.00    Types: Cigarettes  Substance Use Topics  . Alcohol use: Yes    Comment: occ  . Drug use: No    ROS   Objective:   Vitals: BP 124/77   Pulse 73   Temp 98.3 F (36.8 C) (Oral)   Resp 18   Ht 5\' 9"  (1.753 m)   Wt 240 lb (108.9 kg)   SpO2 100%   BMI 35.44 kg/m   Physical Exam Constitutional:      General: He  is not in acute distress.    Appearance: Normal appearance. He is well-developed. He is not ill-appearing, toxic-appearing or diaphoretic.  HENT:     Head: Normocephalic and atraumatic.     Right Ear: External ear normal.     Left Ear: External ear normal.     Nose: Nose normal.     Mouth/Throat:     Mouth: Mucous membranes are moist.     Pharynx: Oropharynx is clear.  Eyes:     General: No scleral icterus.       Right eye: No discharge.        Left eye: No discharge.     Extraocular Movements: Extraocular movements intact.     Conjunctiva/sclera: Conjunctivae normal.     Pupils: Pupils are equal, round, and reactive to light.  Cardiovascular:     Rate and Rhythm: Normal rate and regular rhythm.     Heart sounds: Normal heart sounds. No murmur. No friction rub. No gallop.   Pulmonary:     Effort: Pulmonary effort is normal. No respiratory distress.     Breath sounds: Normal breath sounds. No stridor. No wheezing, rhonchi or rales.  Skin:    General: Skin is warm and  dry.  Neurological:     Mental Status: He is alert and oriented to person, place, and time.  Psychiatric:        Behavior: Behavior normal.        Thought Content: Thought content normal.        Judgment: Judgment normal.     Comments: Anxious demeanor.      ED ECG REPORT   Date: 11/06/2019  Rate: 90bpm  Rhythm: normal sinus rhythm  QRS Axis: normal  Intervals: normal  ST/T Wave abnormalities: normal  Conduction Disutrbances:none  Narrative Interpretation: Sinus rhythm at 90 bpm with multiple premature atrial contractions (different from previous EKG).  Old EKG Reviewed: changes noted  I have personally reviewed the EKG tracing and agree with the computerized printout as noted.   Assessment and Plan :   PDMP not reviewed this encounter.  1. Heart palpitations   2. Gastroesophageal reflux disease, unspecified whether esophagitis present   3. Generalized anxiety disorder   4. Premature atrial  contractions     Discussed sources of PACs.  Patient admits that he works a lot of hours, does 15-16 regularly.  Admits that he is a very difficult time sleeping and is under a lot of stress personally.  Patient requested having a medication to help him sleep and for his anxiety.  He is not inclined to use Xanax because of the possibility of addiction.  Recommended hydroxyzine.  Provided patient with information to cardiologist for continued work-up. Counseled patient on potential for adverse effects with medications prescribed/recommended today, ER and return-to-clinic precautions discussed, patient verbalized understanding.    Wallis Bamberg, PA-C 11/06/19 1414

## 2019-11-09 ENCOUNTER — Emergency Department (HOSPITAL_COMMUNITY)
Admission: EM | Admit: 2019-11-09 | Discharge: 2019-11-09 | Disposition: A | Discharge status: Home / Self Care | Payer: BLUE CROSS/BLUE SHIELD | Attending: Emergency Medicine | Admitting: Emergency Medicine

## 2019-11-09 ENCOUNTER — Other Ambulatory Visit: Payer: Self-pay

## 2019-11-09 ENCOUNTER — Emergency Department (HOSPITAL_COMMUNITY): Payer: BLUE CROSS/BLUE SHIELD

## 2019-11-09 ENCOUNTER — Encounter (HOSPITAL_COMMUNITY): Payer: Self-pay

## 2019-11-09 DIAGNOSIS — I10 Essential (primary) hypertension: Secondary | ICD-10-CM | POA: Insufficient documentation

## 2019-11-09 DIAGNOSIS — R5383 Other fatigue: Secondary | ICD-10-CM | POA: Diagnosis present

## 2019-11-09 DIAGNOSIS — F17210 Nicotine dependence, cigarettes, uncomplicated: Secondary | ICD-10-CM | POA: Diagnosis not present

## 2019-11-09 DIAGNOSIS — R002 Palpitations: Secondary | ICD-10-CM | POA: Diagnosis not present

## 2019-11-09 DIAGNOSIS — Z20822 Contact with and (suspected) exposure to covid-19: Secondary | ICD-10-CM | POA: Insufficient documentation

## 2019-11-09 DIAGNOSIS — Z79899 Other long term (current) drug therapy: Secondary | ICD-10-CM | POA: Insufficient documentation

## 2019-11-09 LAB — BASIC METABOLIC PANEL
Anion gap: 12 (ref 5–15)
BUN: 10 mg/dL (ref 6–20)
CO2: 24 mmol/L (ref 22–32)
Calcium: 9.4 mg/dL (ref 8.9–10.3)
Chloride: 105 mmol/L (ref 98–111)
Creatinine, Ser: 0.83 mg/dL (ref 0.61–1.24)
GFR calc Af Amer: >60 mL/min (ref >60)
GFR calc non Af Amer: >60 mL/min (ref >60)
Glucose, Bld: 92 mg/dL (ref 70–99)
Potassium: 4.1 mmol/L (ref 3.5–5.1)
Sodium: 141 mmol/L (ref 135–145)

## 2019-11-09 LAB — CBC
HCT: 45.9 % (ref 39.0–52.0)
Hemoglobin: 15.3 g/dL (ref 13.0–17.0)
MCH: 28.3 pg (ref 26.0–34.0)
MCHC: 33.3 g/dL (ref 30.0–36.0)
MCV: 84.8 fL (ref 80.0–100.0)
Platelets: 287 10*3/uL (ref 150–400)
RBC: 5.41 MIL/uL (ref 4.22–5.81)
RDW: 12.3 % (ref 11.5–15.5)
WBC: 7.1 10*3/uL (ref 4.0–10.5)
nRBC: 0 % (ref 0.0–0.2)

## 2019-11-09 LAB — SARS CORONAVIRUS 2 BY RT PCR (HOSPITAL ORDER, PERFORMED IN ~~LOC~~ HOSPITAL LAB): SARS Coronavirus 2: NEGATIVE

## 2019-11-09 LAB — POC SARS CORONAVIRUS 2 AG -  ED: SARS Coronavirus 2 Ag: NEGATIVE

## 2019-11-09 LAB — TROPONIN I (HIGH SENSITIVITY): Troponin I (High Sensitivity): 4 ng/L (ref <18)

## 2019-11-09 MED ORDER — SODIUM CHLORIDE 0.9% FLUSH: 3.0000 mL | Freq: Once | INTRAVENOUS | Status: DC

## 2019-11-09 NOTE — Discharge Instructions (Signed)
Keep your appointment with the cardiologist on Wednesday of this week.  You were tested for Covid today, if the results are positive you should be isolated for at least 7 days since the onset of your symptoms AND >72 hours after symptoms resolution (absence of fever without the use of fever reducing medicaiton and improvement in respiratory symptoms), whichever is longer  You may take Tylenol or Motrin for fevers and body aches.  Make sure to stay well-hydrated and get lots of rest.  Please follow up with your primary care provider within 5-7 days for re-evaluation of your symptoms. If you do not have a primary care provider, information for a healthcare clinic has been provided for you to make arrangements for follow up care. Please return to the emergency department for any new or worsening symptoms.

## 2019-11-09 NOTE — ED Provider Notes (Signed)
[facility] EMERGENCY DEPARTMENT Provider Note   CSN: [id number] Arrival date & time: 11/09/19  1606     History Chief Complaint  Patient presents with  . Palpitations    Clarence Armstrong is a 39 y.o. male.  HPI   Pt is a 39 y/o male with a h/o gastritis, anxiety, palpitations, HTN, who presents to the ED today for eval of fatigue.  States he has been very tired for the last several days and has had no energy.  Has had intermittent lightheadedness upon standing as well.    States he has a very tough work schedule and works about 12 to 15 hours/day.  He wakes up often in the middle of the night.  He has a history of panic attacks as well as palpitations.  He was seen in urgent care a few days ago for evaluation of palpitations.  At that time was felt to be related to his anxiety and he was started on hydroxyzine.  He also states that he has an appointment with cardiology in 3 days for assessment.  Denies fevers, chills, rhinorrhea, congestion, cough.  He denies any current chest pain, pleuritic pain, shortness of breath.  States he stopped smoking 2 weeks ago and wonders if this could be contributing.   Past Medical History:  Diagnosis Date  . Antral gastritis    history of  . Anxiety   . Family history of coronary arteriosclerosis 06/17/2013  . Palpitations 06/17/2013  . Unspecified essential hypertension     Patient Active Problem List   Diagnosis Date Noted  . Hyperlipemia 09/19/2013  . Chest pain 06/17/2013  . Family history of coronary arteriosclerosis 06/17/2013  . Essential hypertension 06/17/2013  . Palpitations 06/17/2013    History reviewed. No pertinent surgical history.     Family History  Problem Relation Age of Onset  . Heart Problems Father 55       PCI-stent     Social History   Tobacco Use  . Smoking status: Current Some Day Smoker    Years: 11.00    Types: Cigarettes  Substance Use Topics  . Alcohol use: Yes    Comment: occ  .  Drug use: No    Home Medications Prior to Admission medications   Medication Sig Start Date End Date Taking? Authorizing Provider  ALPRAZolam (XANAX) 0.25 MG tablet Take 0.25 mg by mouth 2 (two) times daily as needed for anxiety.    [provider]  amLODipine (NORVASC) 5 MG tablet Take 5 mg by mouth daily.    [provider]  hydrOXYzine (ATARAX/VISTARIL) 25 MG tablet Take 1 tablet (25 mg total) by mouth at bedtime as needed for anxiety. 11/06/19   Wallis Bamberg, PA-C  propranolol (INDERAL) 10 MG tablet Take 10 mg by mouth at bedtime.    [provider]  clonazePAM (KLONOPIN) 0.5 MG tablet Take 1 tablet (0.5 mg total) by mouth 2 (two) times daily as needed for anxiety. 09/11/14 11/06/19  Linna Hoff, MD  DULOXETINE HCL PO Take by mouth.  11/06/19  [provider]  pantoprazole (PROTONIX) 40 MG tablet Take 1 tablet (40 mg total) by mouth daily. 10/29/13 11/06/19  Lars Masson, MD  PARoxetine (PAXIL-CR) 12.5 MG 24 hr tablet Take 12.5 mg by mouth 2 (two) times daily.  11/06/19  [provider]    Allergies    Crestor [rosuvastatin] and Lipitor [atorvastatin]  Review of Systems   Review of Systems  Constitutional: Positive for fatigue.  Negative for chills and fever.  HENT: Negative for ear pain and sore throat.   Eyes: Negative for visual disturbance.  Respiratory: Negative for cough and shortness of breath.   Cardiovascular: Positive for palpitations. Negative for chest pain.  Gastrointestinal: Negative for abdominal pain, constipation, diarrhea, nausea and vomiting.  Genitourinary: Negative for dysuria and hematuria.  Musculoskeletal: Negative for back pain.  Skin: Negative for rash.  Neurological: Negative for headaches.  All other systems reviewed and are negative.   Physical Exam Updated Vital Signs BP (!) 133/99 (BP Location: Right Arm)   Pulse 86   Temp 97.9 F (36.6 C) (Oral)   Resp 19   Ht 5\' 9"  (1.753 m)   Wt 93.9 kg    SpO2 99%   BMI 30.57 kg/m   Physical Exam Vitals and nursing note reviewed.  Constitutional:      Appearance: He is well-developed.  HENT:     Head: Normocephalic and atraumatic.  Eyes:     Conjunctiva/sclera: Conjunctivae normal.  Cardiovascular:     Rate and Rhythm: Normal rate and regular rhythm.     Pulses: Normal pulses.     Heart sounds: Normal heart sounds. No murmur.  Pulmonary:     Effort: Pulmonary effort is normal. No respiratory distress.     Breath sounds: Normal breath sounds. No wheezing, rhonchi or rales.  Abdominal:     General: Bowel sounds are normal.     Palpations: Abdomen is soft.     Tenderness: There is no abdominal tenderness. There is no guarding or rebound.  Musculoskeletal:     Cervical back: Neck supple.  Skin:    General: Skin is warm and dry.  Neurological:     Mental Status: He is alert.     Comments: Alert, clear speech, moving all extremities     ED Results / Procedures / Treatments   Labs (all labs ordered are listed, but only abnormal results are displayed) Labs Reviewed  SARS CORONAVIRUS 2 BY RT PCR (HOSPITAL ORDER, PERFORMED IN [facility])  BASIC METABOLIC PANEL  CBC  POC SARS CORONAVIRUS 2 AG -  ED  TROPONIN I (HIGH SENSITIVITY)    EKG None  Radiology DG Chest 2 View  Result Date: 11/09/2019 CLINICAL DATA:  Cardiac palpitations and dizziness.  Hypertension EXAM: CHEST - 2 VIEW COMPARISON:  December 04, 2013 FINDINGS: Lungs are clear. Heart size and pulmonary vascularity are normal. No adenopathy. No pneumothorax. No bone lesions. IMPRESSION: Lungs clear.  Cardiac silhouette within normal limits. Electronically Signed   By: December 06, 2013 III M.D.   On: 11/09/2019 17:35    Procedures Procedures (including critical care time)  Medications Ordered in ED Medications  sodium chloride flush (NS) 0.9 % injection 3 mL (has no administration in time range)    ED Course  I have reviewed the triage vital signs and  the nursing notes.  Pertinent labs & imaging results that were available during my care of the patient were reviewed by me and considered in my medical decision making (see chart for details).  Clinical Course as of Nov 08 2104  Sat Nov 09, 2019  2058 EKG 12-Lead [CC]    Clinical Course User Index [CC] Nov 11, 2019, Karrie Meres   39 year old male presenting for evaluation of fatigue starting 3 days ago.  Also complaining of some palpitations.  He has history of same and was seen in urgent care several days ago.  He also has history of panic attacks.  He was started on hydroxyzine at that time given follow-up with cardiology whom he is planning to see in 3 days.  He denies any infectious symptoms however his initial temp was 99.6.  His vital signs have otherwise been reassuring.  Question whether or not his symptoms of fatigue may be related to viral illness such as COVID-19 therefore he was tested for this.  Point-of-care test was negative however send out test pending at the time of discharge.  In regards to his palpitations, his laboratory work was reviewed today and is benign.  CBC was without leukocytosis or anemia.  BMP did not show any electrolyte derangement.  Troponin is negative.  EKG showed normal sinus rhythm with a heart rate of 9.  No arrhythmia or ischemia.  Chest x-ray was negative for any acute findings.  He was monitored in the ED for several hours and states that he actually was feeling improved upon his ED visit.  I do not feel that he has any evidence of an emergent etiology of symptoms at this time that would require further work-up or admission.  Feel he is appropriate for follow-up with cardiology and his PCP.  Have advised on specific return precautions.  He voiced understanding of the plan and reasons to return. all questions answered.  Patient stable for discharge.  Clarence Armstrong was evaluated in Emergency Department on 11/09/2019 for the symptoms described in the history of  present illness. He was evaluated in the context of the global COVID-19 pandemic, which necessitated consideration that the patient might be at risk for infection with the SARS-CoV-2 virus that causes COVID-19. Institutional protocols and algorithms that pertain to the evaluation of patients at risk for COVID-19 are in a state of rapid change based on information released by regulatory bodies including the CDC and federal and state organizations. These policies and algorithms were followed during the patient's care in the ED.    MDM Rules/Calculators/A&P                       Final Clinical Impression(s) / ED Diagnoses Final diagnoses:  Other fatigue  Palpitations    Rx / DC Orders ED Discharge Orders    None       Bishop Dublin 11/09/19 2106    Dorie Rank, MD 11/10/19 1504

## 2019-11-09 NOTE — ED Triage Notes (Signed)
Patient complains of intermittent palpitations with GERD for several days. Has been seen at [facility] and primary MD for same. Complains of intermitent dizziness with same. Reports BP has been running high. Patient in no distress

## 2019-11-13 ENCOUNTER — Other Ambulatory Visit: Payer: Self-pay

## 2019-11-13 ENCOUNTER — Encounter: Payer: Self-pay | Admitting: Cardiology

## 2019-11-13 ENCOUNTER — Ambulatory Visit: Payer: BLUE CROSS/BLUE SHIELD | Admitting: Cardiology

## 2019-11-13 ENCOUNTER — Ambulatory Visit
Admission: RE | Admit: 2019-11-13 | Discharge: 2019-11-13 | Disposition: A | Discharge status: Home / Self Care | Payer: Self-pay | Source: Ambulatory Visit | Attending: Cardiology | Admitting: Cardiology

## 2019-11-13 VITALS — BP 122/90 | HR 90 | Ht 69.0 in | Wt 210.0 lb

## 2019-11-13 DIAGNOSIS — R079 Chest pain, unspecified: Secondary | ICD-10-CM

## 2019-11-13 DIAGNOSIS — Z8249 Family history of ischemic heart disease and other diseases of the circulatory system: Secondary | ICD-10-CM

## 2019-11-13 DIAGNOSIS — I491 Atrial premature depolarization: Secondary | ICD-10-CM | POA: Diagnosis not present

## 2019-11-13 DIAGNOSIS — R002 Palpitations: Secondary | ICD-10-CM | POA: Diagnosis not present

## 2019-11-13 DIAGNOSIS — I10 Essential (primary) hypertension: Secondary | ICD-10-CM | POA: Diagnosis not present

## 2019-11-13 NOTE — Patient Instructions (Signed)
Medication Instructions:  The current medical regimen is effective;  continue present plan and medications.  *If you need a refill on your cardiac medications before your next appointment, please call your pharmacy*   Testing/Procedures: Your physician has requested that you have Coronary Calcium Score which is a CT painless test that uses an x-ray machine to take clear, detailed pictures of your heart. The cost of this testing is $150 due at the time of the testing.  Follow-Up: At [facility], you and your health needs are our priority.  As part of our continuing mission to provide you with exceptional heart care, we have created designated Provider Care Teams.  These Care Teams include your primary Cardiologist (physician) and Advanced Practice Providers (APPs -  Physician Assistants and Nurse Practitioners) who all work together to provide you with the care you need, when you need it.  We recommend signing up for the patient portal called "MyChart".  Sign up information is provided on this After Visit Summary.  MyChart is used to connect with patients for Virtual Visits (Telemedicine).  Patients are able to view lab/test results, encounter notes, upcoming appointments, etc.  Non-urgent messages can be sent to your provider as well.   To learn more about what you can do with MyChart, go to [email or web address].    Follow up as needed after the above testing.  Thank you for choosing [facility]!!

## 2019-11-13 NOTE — Progress Notes (Signed)
Cardiology Office Note:    Date:  11/13/2019   ID:  Tysean Vandervliet, DOB 09-Dec-1980, MRN [id number]  PCP:  Lewis Moccasin, MD  Cardiologist:  Donato Schultz, MD  Electrophysiologist:  None   Referring MD: Lewis Moccasin, MD     History of Present Illness:    Clarence Armstrong is a 39 y.o. male here for the evaluation of palpitations at the request of Dr. Duanne Guess.  Has been seen in the urgent care recently on 11/09/2019 with intermittent lightheadedness, severe fatigue for last several days with no energy.  Works at [employer], 12 to 15 hours a day often wakes up with anxiety.  Prior history of panic attacks with palpitations.  Was given hydroxyzine or Vistaril to help.  Right now feeling well.  He had a copy of an EKG from prior urgent care visit that showed PACs.  They were symptomatic at the time.  1am dinner, chest pressure, after dinner. Felt missing beats. PAC's noted on ECG. Dr. Delton See 2015.   Married. Walks. BP drops after exercise, vasodilated. Takes amlodipine 2.5. Tennis with wife. 55 bpm.   Did stop smoking about 3 weeks ago.  Temperature was 99.6.  Covid point-of-care test was negative send out was negative.  Troponin was negative.  No electrolyte derangement.  Chest x-ray normal.  EKG showed normal sinus rhythm.    Past Medical History:  Diagnosis Date   Antral gastritis    history of   Anxiety    Family history of coronary arteriosclerosis 06/17/2013   Palpitations 06/17/2013   Unspecified essential hypertension     No past surgical history on file.  Current Medications: Current Meds  Medication Sig   ALPRAZolam (XANAX) 0.25 MG tablet Take 0.25 mg by mouth 2 (two) times daily as needed for anxiety.   amLODipine (NORVASC) 5 MG tablet Take 5 mg by mouth daily.   hydrOXYzine (ATARAX/VISTARIL) 25 MG tablet Take 1 tablet (25 mg total) by mouth at bedtime as needed for anxiety.   [DISCONTINUED] pantoprazole (PROTONIX) 40 MG tablet Take 1 tablet (40 mg  total) by mouth daily.   [DISCONTINUED] propranolol (INDERAL) 10 MG tablet Take 10 mg by mouth at bedtime.     Allergies:   Crestor [rosuvastatin] and Lipitor [atorvastatin]   Social History   Socioeconomic History   Marital status: Married    Spouse name: Not on file   Number of children: Not on file   Years of education: Not on file   Highest education level: Not on file  Occupational History   Not on file  Tobacco Use   Smoking status: Current Some Day Smoker    Years: 11.00    Types: Cigarettes  Substance and Sexual Activity   Alcohol use: Yes    Comment: occ   Drug use: No   Sexual activity: Not on file  Other Topics Concern   Not on file  Social History Narrative   Not on file   Social Determinants of Health   Financial Resource Strain:    Difficulty of Paying Living Expenses:   Food Insecurity:    Worried About Programme researcher, broadcasting/film/video in the Last Year:    Barista in the Last Year:   Transportation Needs:    Freight forwarder (Medical):    Lack of Transportation (Non-Medical):   Physical Activity:    Days of Exercise per Week:    Minutes of Exercise per Session:   Stress:    Feeling of  Stress :   Social Connections:    Frequency of Communication with Friends and Family:    Frequency of Social Gatherings with Friends and Family:    Attends Religious Services:    Active Member of Clubs or Organizations:    Attends Archivist Meetings:    Marital Status:      Family History: The patient's family history includes Heart Problems (age of onset: 62) in his father.  ROS:   Please see the history of present illness.    Denies any fevers chills nausea vomiting syncope bleeding all other systems reviewed and are negative.  EKGs/Labs/Other Studies Reviewed:    The following studies were reviewed today: Prior exercise treadmill test in 2015 was low risk.  Chest x-ray personally reviewed on 11/09/2019 shows no  evidence of cardiac issues.  EKG:  EKG is not ordered today.  Prior normal sinus rhythm no other abnormalities.  Recent Labs: 11/09/2019: BUN 10; Creatinine, Ser 0.83; Hemoglobin 15.3; Platelets 287; Potassium 4.1; Sodium 141  Recent Lipid Panel    Component Value Date/Time   CHOL 184 06/24/2014 1015   TRIG 128 06/24/2014 1015   HDL 33 (L) 06/24/2014 1015   CHOLHDL 5 12/25/2013 0947   VLDL 38.2 12/25/2013 0947   LDLCALC 125 (H) 06/24/2014 1015   LDLDIRECT 178.8 06/17/2013 1034    Physical Exam:    VS:  BP 122/90    Pulse 90    Ht 5\' 9"  (1.753 m)    Wt 210 lb (95.3 kg)    SpO2 96%    BMI 31.01 kg/m     Wt Readings from Last 3 Encounters:  11/13/19 210 lb (95.3 kg)  11/09/19 207 lb (93.9 kg)  11/06/19 240 lb (108.9 kg)     GEN:  Well nourished, well developed in no acute distress HEENT: Normal NECK: No JVD; No carotid bruits LYMPHATICS: No lymphadenopathy CARDIAC: RRR, no murmurs, rubs, gallops RESPIRATORY:  Clear to auscultation without rales, wheezing or rhonchi  ABDOMEN: Soft, non-tender, non-distended MUSCULOSKELETAL:  No edema; No deformity  SKIN: Warm and dry NEUROLOGIC:  Alert and oriented x 3 PSYCHIATRIC:  Normal affect   ASSESSMENT:    1. Premature atrial contractions   2. Essential hypertension   3. Palpitations   4. Family history of coronary arteriosclerosis   5. Chest pain, unspecified type    PLAN:    In order of problems listed above:  Palpitations/PACs -Showed me an EKG today with PACs.  Palpitations were noted during that time.  He had been on propranolol in the past 10 mg at bedtime but he is not taking.  I am fine with him not taking this medication.  Conservative management makes sense.  Watch caffeine, good sleep hygiene.  Explained to him that PACs can come and go.  Can follow in the patterns as he was feeling previously.  Continue with good diet and exercise.  These are benign.  Family history of heart disease -Given his recent tobacco  cessation, family history of heart disease, I will go ahead and check a coronary calcium score.  Instructed him on cost of test risks and benefits.  Very low radiation dose.  This could help guide [place] on use of Crestor, statin therapy for instance for long-term prevention.  Previously showed allergies before to Crestor, muscle aches and atorvastatin as well.  In the future, we may start with 5 mg once a week and work our way up if needed.   Medication Adjustments/Labs and Tests  Ordered: Current medicines are reviewed at length with the patient today.  Concerns regarding medicines are outlined above.  Orders Placed This Encounter  Procedures   CT CARDIAC SCORING   No orders of the defined types were placed in this encounter.   Patient Instructions  Medication Instructions:  The current medical regimen is effective;  continue present plan and medications.  *If you need a refill on your cardiac medications before your next appointment, please call your pharmacy*   Testing/Procedures: Your physician has requested that you have Coronary Calcium Score which is a CT painless test that uses an x-ray machine to take clear, detailed pictures of your heart. The cost of this testing is $150 due at the time of the testing.  Follow-Up: At [facility], you and your health needs are our priority.  As part of our continuing mission to provide you with exceptional heart care, we have created designated Provider Care Teams.  These Care Teams include your primary Cardiologist (physician) and Advanced Practice Providers (APPs -  Physician Assistants and Nurse Practitioners) who all work together to provide you with the care you need, when you need it.  We recommend signing up for the patient portal called "MyChart".  Sign up information is provided on this After Visit Summary.  MyChart is used to connect with patients for Virtual Visits (Telemedicine).  Patients are able to view lab/test results, encounter  notes, upcoming appointments, etc.  Non-urgent messages can be sent to your provider as well.   To learn more about what you can do with MyChart, go to [email or web address].    Follow up as needed after the above testing.  Thank you for choosing [facility]!!        Signed, Donato Schultz, MD  11/13/2019 5:06 PM    [facility]

## 2019-11-14 NOTE — Telephone Encounter (Signed)
This is not something that needs to be seen urgently but I will refer you pulmonary for further discussion. Thanks  Donato Schultz, MD

## 2019-11-15 ENCOUNTER — Telehealth: Payer: Self-pay | Admitting: *Deleted

## 2019-11-15 DIAGNOSIS — J432 Centrilobular emphysema: Secondary | ICD-10-CM

## 2019-11-15 DIAGNOSIS — R918 Other nonspecific abnormal finding of lung field: Secondary | ICD-10-CM

## 2019-11-15 NOTE — Telephone Encounter (Signed)
Jake Bathe, MD 13 hours ago (6:35 PM)     This is not something that needs to be seen urgently but I will refer you pulmonary for further discussion. Thanks   Donato Schultz, MD       ===View-only below this line===   ----- Message -----      From:Clarence Armstrong      Sent:11/14/2019 11:24 AM EDT        NV:BTYO Anne Fu, MD   Subject:Visit Follow-Up Question  Thank you so much, Dr. Anne Fu for your response. I am wondering if the "mild centrilobular emphysema"  condition needs to be seen right away by a doctor?  I read the report at night and got a little worried reading about it.   Thanks, Clarence Armstrong ---------------------------------------------------------------------------------------------------------------- Excellent score of 0 - no coronary calcium detected. However there are small pulmonary nodules noted and radiology recommends a repeat non contrast chest CT in 12 months to ensure stability. Also there was finding of mild centrilobular emphysema. Very happy that you decided to quit smoking! If you remain concerned about these lung findings, I would be happy to refer to pulmonary clinic for discussion Kandice Robinsons, NP). Donato Schultz, MD  Order placed for pulmonary consult.

## 2020-01-02 ENCOUNTER — Institutional Professional Consult (permissible substitution): Payer: BLUE CROSS/BLUE SHIELD | Admitting: Pulmonary Disease

## 2020-05-20 ENCOUNTER — Emergency Department (HOSPITAL_COMMUNITY): Payer: BLUE CROSS/BLUE SHIELD

## 2020-05-20 ENCOUNTER — Encounter (HOSPITAL_COMMUNITY): Payer: Self-pay | Admitting: *Deleted

## 2020-05-20 ENCOUNTER — Other Ambulatory Visit: Payer: Self-pay

## 2020-05-20 ENCOUNTER — Emergency Department (HOSPITAL_COMMUNITY)
Admission: EM | Admit: 2020-05-20 | Discharge: 2020-05-21 | Disposition: A | Discharge status: Home / Self Care | Payer: BLUE CROSS/BLUE SHIELD | Attending: Emergency Medicine | Admitting: Emergency Medicine

## 2020-05-20 DIAGNOSIS — Z87891 Personal history of nicotine dependence: Secondary | ICD-10-CM | POA: Insufficient documentation

## 2020-05-20 DIAGNOSIS — Z79899 Other long term (current) drug therapy: Secondary | ICD-10-CM | POA: Diagnosis not present

## 2020-05-20 DIAGNOSIS — I10 Essential (primary) hypertension: Secondary | ICD-10-CM | POA: Diagnosis not present

## 2020-05-20 DIAGNOSIS — R5383 Other fatigue: Secondary | ICD-10-CM | POA: Diagnosis not present

## 2020-05-20 DIAGNOSIS — R5381 Other malaise: Secondary | ICD-10-CM | POA: Insufficient documentation

## 2020-05-20 LAB — URINALYSIS, ROUTINE W REFLEX MICROSCOPIC
Bilirubin Urine: NEGATIVE
Glucose, UA: NEGATIVE mg/dL
Hgb urine dipstick: NEGATIVE
Ketones, ur: NEGATIVE mg/dL
Leukocytes,Ua: NEGATIVE
Nitrite: NEGATIVE
Protein, ur: NEGATIVE mg/dL
Specific Gravity, Urine: 1.011 (ref 1.005–1.030)
pH: 6 (ref 5.0–8.0)

## 2020-05-20 LAB — CBC
HCT: 45.2 % (ref 39.0–52.0)
Hemoglobin: 15.1 g/dL (ref 13.0–17.0)
MCH: 28.4 pg (ref 26.0–34.0)
MCHC: 33.4 g/dL (ref 30.0–36.0)
MCV: 85.1 fL (ref 80.0–100.0)
Platelets: 274 10*3/uL (ref 150–400)
RBC: 5.31 MIL/uL (ref 4.22–5.81)
RDW: 12.2 % (ref 11.5–15.5)
WBC: 7.3 10*3/uL (ref 4.0–10.5)
nRBC: 0 % (ref 0.0–0.2)

## 2020-05-20 LAB — BASIC METABOLIC PANEL
Anion gap: 8 (ref 5–15)
BUN: 13 mg/dL (ref 6–20)
CO2: 24 mmol/L (ref 22–32)
Calcium: 9.3 mg/dL (ref 8.9–10.3)
Chloride: 103 mmol/L (ref 98–111)
Creatinine, Ser: 0.8 mg/dL (ref 0.61–1.24)
GFR, Estimated: >60 mL/min (ref >60)
Glucose, Bld: 96 mg/dL (ref 70–99)
Potassium: 3.5 mmol/L (ref 3.5–5.1)
Sodium: 135 mmol/L (ref 135–145)

## 2020-05-20 LAB — TROPONIN I (HIGH SENSITIVITY)
Troponin I (High Sensitivity): <2 ng/L (ref <18)
Troponin I (High Sensitivity): <2 ng/L (ref <18)

## 2020-05-20 NOTE — ED Triage Notes (Signed)
Pt has been having 2-3 weeks of dizziness, not feeling well, and sleeping lighter than usual. Today reports coughing up phlegm, once was a light pink color then another episode of streaks of blood. Pt has increased dizziness when running on the treadmill and has epigastric pain with movement as well.Reports recently started amlodipine and lexapro.

## 2020-05-21 LAB — D-DIMER, QUANTITATIVE: D-Dimer, Quant: <0.27 ug/mL-FEU (ref 0.00–0.50)

## 2020-05-21 NOTE — Discharge Instructions (Signed)
Your work-up in the emergency department today has been reassuring.  We advise follow-up with your primary care doctor for symptom recheck.  You may return to the ED for new or concerning symptoms.

## 2020-05-21 NOTE — ED Provider Notes (Signed)
[facility] EMERGENCY DEPARTMENT Provider Note   CSN: [id number] Arrival date & time: 05/20/20  1805     History Chief Complaint  Patient presents with  . Hemoptysis    Clarence Armstrong is a 39 y.o. male.  39 year old male with history of reflux, anxiety, palpitations, hypertension presents to the emergency department for complaints of fatigue and malaise.  He reports that he has had these symptoms intermittently over the past 2 to 3 weeks.  Specifically felt like his fatigue was worse when he was using a treadmill recently.  Noticed some light pink mucous when he coughed today.  This was followed by a more forceful cough productive of phlegm and streak of blood.  Cough since has not produced any hemoptysis.  Patient does confirm prior similar episodes of fatigue/malaise.  Saw cardiology for this in June with reassuring Holter and coronary CT.  Does endorse poor sleep recently as well as worsening anxiety.  Meals have been fairly consistent, but does not drink fluids regularly.  Was started on Lexapro 4 days ago for anxiety management, but has not noticed much change since starting this medication.  No fevers, syncope, leg swelling, recent surgeries or hospitalizations.  Quit smoking approximately 7 months ago.        Past Medical History:  Diagnosis Date  . Antral gastritis    history of  . Anxiety   . Family history of coronary arteriosclerosis 06/17/2013  . Palpitations 06/17/2013  . Unspecified essential hypertension     Patient Active Problem List   Diagnosis Date Noted  . Hyperlipemia 09/19/2013  . Chest pain 06/17/2013  . Family history of coronary arteriosclerosis 06/17/2013  . Essential hypertension 06/17/2013  . Palpitations 06/17/2013    History reviewed. No pertinent surgical history.     Family History  Problem Relation Age of Onset  . Heart Problems Father 40       PCI-stent     Social History   Tobacco Use  . Smoking status: Former  Smoker    Years: 11.00    Types: Cigarettes    Quit date: 11/19/2019    Years since quitting: 0.5  Substance Use Topics  . Alcohol use: Yes    Comment: occ  . Drug use: No    Home Medications Prior to Admission medications   Medication Sig Start Date End Date Taking? Authorizing Provider  ALPRAZolam (XANAX) 0.25 MG tablet Take 0.25 mg by mouth 2 (two) times daily as needed for anxiety.    [provider]  amLODipine (NORVASC) 5 MG tablet Take 5 mg by mouth daily.    [provider]  hydrOXYzine (ATARAX/VISTARIL) 25 MG tablet Take 1 tablet (25 mg total) by mouth at bedtime as needed for anxiety. 11/06/19   Wallis Bamberg, PA-C  clonazePAM (KLONOPIN) 0.5 MG tablet Take 1 tablet (0.5 mg total) by mouth 2 (two) times daily as needed for anxiety. 09/11/14 11/06/19  Linna Hoff, MD  DULOXETINE HCL PO Take by mouth.  11/06/19  [provider]  pantoprazole (PROTONIX) 40 MG tablet Take 1 tablet (40 mg total) by mouth daily. 10/29/13 11/13/19  Lars Masson, MD  PARoxetine (PAXIL-CR) 12.5 MG 24 hr tablet Take 12.5 mg by mouth 2 (two) times daily.  11/06/19  [provider]    Allergies    Crestor [rosuvastatin] and Lipitor [atorvastatin]  Review of Systems   Review of Systems  Ten systems reviewed and are negative for acute change, except as noted in the  HPI.    Physical Exam Updated Vital Signs BP (!) 145/102   Pulse 60   Temp 98.2 F (36.8 C) (Oral)   Resp 18   SpO2 98%   Physical Exam Vitals and nursing note reviewed.  Constitutional:      General: He is not in acute distress.    Appearance: He is well-developed. He is not diaphoretic.  HENT:     Head: Normocephalic and atraumatic.  Eyes:     General: No scleral icterus.    Conjunctiva/sclera: Conjunctivae normal.  Cardiovascular:     Rate and Rhythm: Normal rate and regular rhythm.     Pulses: Normal pulses.  Pulmonary:     Effort: Pulmonary effort is normal. No respiratory distress.      Breath sounds: No stridor. No wheezing, rhonchi or rales.     Comments: Lungs CTAB. Respirations even and unlabored. Musculoskeletal:        General: Normal range of motion.     Cervical back: Normal range of motion.  Skin:    General: Skin is warm and dry.     Coloration: Skin is not pale.     Findings: No erythema or rash.  Neurological:     Mental Status: He is alert and oriented to person, place, and time.     Coordination: Coordination normal.  Psychiatric:        Mood and Affect: Mood is anxious.        Behavior: Behavior normal.     ED Results / Procedures / Treatments   Labs (all labs ordered are listed, but only abnormal results are displayed) Labs Reviewed  BASIC METABOLIC PANEL  CBC  URINALYSIS, ROUTINE W REFLEX MICROSCOPIC  D-DIMER, QUANTITATIVE (NOT AT [facility])  TROPONIN I (HIGH SENSITIVITY)  TROPONIN I (HIGH SENSITIVITY)    EKG None  Radiology DG Chest 2 View  Result Date: 05/20/2020 CLINICAL DATA:  Coughing up blood. History of 3 lung nodules. Former smoker. EXAM: CHEST - 2 VIEW COMPARISON:  Chest radiograph Nov 09, 2019. FINDINGS: The heart size and mediastinal contours are within normal limits. No consolidation. No visible pleural effusions or pneumothorax. No acute osseous abnormality. IMPRESSION: No acute cardiopulmonary disease. Given reported hemoptysis and history of pulmonary nodules, recommend low threshold for chest CT to further evaluate. Electronically Signed   By: Feliberto Harts MD   On: 05/20/2020 19:57    Procedures Procedures (including critical care time)  Medications Ordered in ED Medications - No data to display  ED Course  I have reviewed the triage vital signs and the nursing notes.  Pertinent labs & imaging results that were available during my care of the patient were reviewed by me and considered in my medical decision making (see chart for details).    MDM Rules/Calculators/A&P                          39 year old male  presenting for nonspecific malaise and fatigue over the past few weeks.  Has had similar symptoms in the past for which she was evaluated by cardiology.  Suspect that many of the symptoms may be related to anxiety and poor sleeping habits.  Does report concern surrounding a coughing episode productive of phlegm streaked with bright red blood.  Patient notes one episode and denies recurrence.  His cardiac evaluation has been reassuring with negative troponin x2.  Chest x-ray without acute cardiopulmonary abnormality.  Specifically, no pneumothorax, pleural effusion, pneumonia, rib fracture, mediastinal widening.  A D-dimer was completed given reports of hemoptysis.  This is negative.  He has a low pretest probability for pulmonary embolus.  Do not feel that symptoms are consistent with this diagnosis.  Suspect mild degree of bronchial irritation, self-limiting.  Lungs clear to auscultation and no hypoxia while in the ED.  He is resting comfortably on repeat assessment.  Have encouraged the patient to continue follow-up with his primary care doctor.  Counseled on the need for adequate PO fluid intake.  Return precautions discussed and provided. Patient discharged in stable condition with no unaddressed concerns.   Final Clinical Impression(s) / ED Diagnoses Final diagnoses:  Malaise and fatigue    Rx / DC Orders ED Discharge Orders    None       Antony Madura, PA-C 05/21/20 0175    Alvira Monday, MD 05/21/20 2220

## 2020-07-18 ENCOUNTER — Emergency Department (HOSPITAL_COMMUNITY)
Admission: EM | Admit: 2020-07-18 | Discharge: 2020-07-19 | Disposition: A | Discharge status: Home / Self Care | Payer: 59 | Attending: Emergency Medicine | Admitting: Emergency Medicine

## 2020-07-18 ENCOUNTER — Other Ambulatory Visit: Payer: Self-pay

## 2020-07-18 DIAGNOSIS — R112 Nausea with vomiting, unspecified: Secondary | ICD-10-CM | POA: Insufficient documentation

## 2020-07-18 DIAGNOSIS — R5383 Other fatigue: Secondary | ICD-10-CM | POA: Diagnosis not present

## 2020-07-18 DIAGNOSIS — Z5321 Procedure and treatment not carried out due to patient leaving prior to being seen by health care provider: Secondary | ICD-10-CM | POA: Insufficient documentation

## 2020-07-18 LAB — CBC
HCT: 46.1 % (ref 39.0–52.0)
Hemoglobin: 15.1 g/dL (ref 13.0–17.0)
MCH: 27.8 pg (ref 26.0–34.0)
MCHC: 32.8 g/dL (ref 30.0–36.0)
MCV: 84.9 fL (ref 80.0–100.0)
Platelets: 279 10*3/uL (ref 150–400)
RBC: 5.43 MIL/uL (ref 4.22–5.81)
RDW: 12.1 % (ref 11.5–15.5)
WBC: 7.7 10*3/uL (ref 4.0–10.5)
nRBC: 0 % (ref 0.0–0.2)

## 2020-07-18 LAB — COMPREHENSIVE METABOLIC PANEL
ALT: 37 U/L (ref 0–44)
AST: 26 U/L (ref 15–41)
Albumin: 4.3 g/dL (ref 3.5–5.0)
Alkaline Phosphatase: 63 U/L (ref 38–126)
Anion gap: 15 (ref 5–15)
BUN: 11 mg/dL (ref 6–20)
CO2: 21 mmol/L — ABNORMAL LOW (ref 22–32)
Calcium: 8.7 mg/dL — ABNORMAL LOW (ref 8.9–10.3)
Chloride: 104 mmol/L (ref 98–111)
Creatinine, Ser: 0.66 mg/dL (ref 0.61–1.24)
GFR, Estimated: >60 mL/min (ref >60)
Glucose, Bld: 102 mg/dL — ABNORMAL HIGH (ref 70–99)
Potassium: 3.5 mmol/L (ref 3.5–5.1)
Sodium: 140 mmol/L (ref 135–145)
Total Bilirubin: 0.7 mg/dL (ref 0.3–1.2)
Total Protein: 7.6 g/dL (ref 6.5–8.1)

## 2020-07-18 LAB — URINALYSIS, ROUTINE W REFLEX MICROSCOPIC
Bacteria, UA: NONE SEEN
Bilirubin Urine: NEGATIVE
Glucose, UA: NEGATIVE mg/dL
Hgb urine dipstick: NEGATIVE
Ketones, ur: 5 mg/dL — AB
Leukocytes,Ua: NEGATIVE
Nitrite: NEGATIVE
Protein, ur: 30 mg/dL — AB
Specific Gravity, Urine: 1.024 (ref 1.005–1.030)
pH: 7 (ref 5.0–8.0)

## 2020-07-18 LAB — LIPASE, BLOOD: Lipase: 28 U/L (ref 11–51)

## 2020-07-18 LAB — ETHANOL: Alcohol, Ethyl (B): <10 mg/dL (ref <10)

## 2020-07-18 MED ORDER — ONDANSETRON 4 MG PO TBDP
4.0000 mg | ORAL_TABLET | Freq: Once | ORAL | Status: AC | PRN
  Administered 2020-07-18: 4 mg via ORAL

## 2020-07-18 NOTE — ED Triage Notes (Signed)
Pt presents to ED POV. Pt c/o nausea, lethargy. Pt reports that he was up late last night and drank more than half a bottle of scotch. Pt reports emesis x5 today.

## 2020-07-19 ENCOUNTER — Encounter (HOSPITAL_COMMUNITY): Payer: Self-pay | Admitting: Emergency Medicine

## 2020-07-19 ENCOUNTER — Other Ambulatory Visit: Payer: Self-pay

## 2020-07-19 ENCOUNTER — Ambulatory Visit (HOSPITAL_COMMUNITY)
Admission: EM | Admit: 2020-07-19 | Discharge: 2020-07-19 | Disposition: A | Discharge status: Home / Self Care | Payer: 59 | Attending: Emergency Medicine | Admitting: Emergency Medicine

## 2020-07-19 DIAGNOSIS — R42 Dizziness and giddiness: Secondary | ICD-10-CM

## 2020-07-19 DIAGNOSIS — R112 Nausea with vomiting, unspecified: Secondary | ICD-10-CM | POA: Diagnosis not present

## 2020-07-19 DIAGNOSIS — Z789 Other specified health status: Secondary | ICD-10-CM

## 2020-07-19 MED ORDER — ONDANSETRON 4 MG PO TBDP
ORAL_TABLET | ORAL | Status: AC
  Filled 2020-07-19: qty 1

## 2020-07-19 MED ORDER — IBUPROFEN 800 MG PO TABS
ORAL_TABLET | ORAL | Status: AC
  Filled 2020-07-19: qty 1

## 2020-07-19 MED ORDER — ONDANSETRON 4 MG PO TBDP: 4.0000 mg | ORAL_TABLET | Freq: Three times a day (TID) | ORAL | 0 refills | Status: AC | PRN

## 2020-07-19 MED ORDER — IBUPROFEN 800 MG PO TABS
800.0000 mg | ORAL_TABLET | Freq: Once | ORAL | Status: AC
  Administered 2020-07-19: 800 mg via ORAL

## 2020-07-19 MED ORDER — ONDANSETRON 4 MG PO TBDP: 4.0000 mg | ORAL_TABLET | Freq: Once | ORAL | Status: DC

## 2020-07-19 NOTE — ED Provider Notes (Signed)
[facility]    CSN: [id number] Arrival date & time: 07/19/20  1134      History   Chief Complaint Chief Complaint  Patient presents with  . Emesis  . Dizziness    HPI Clarence Armstrong is a 40 y.o. male.   Clarence Armstrong presents with complaints of headache, dizziness, nausea and vomiting following an episode of binge drinking over night 2/4-2/5. Yesterday he woke and had multiple episodes of emesis along with the headache and dizziness. Wasn't able to keep anything down. He became concerned so went to the ER yesterday. Basic labs and urine were collected, and zofran was given. He left due to wait time. zofran has helped. He has been able to drink some and eat toast today. He noted on his mychart his lab results, particularly that there was ketones to his urine sample, so he returned to UC for evaluation. No further emesis and symptoms are generally improved. He states he tends to drink approximately twice a month. Hasn't taken any medications for his headache. Has not taken his htn medications.     ROS per HPI, negative if not otherwise mentioned.       Past Medical History:  Diagnosis Date  . Antral gastritis    history of  . Anxiety   . Family history of coronary arteriosclerosis 06/17/2013  . Palpitations 06/17/2013  . Unspecified essential hypertension     Patient Active Problem List   Diagnosis Date Noted  . Hyperlipemia 09/19/2013  . Chest pain 06/17/2013  . Family history of coronary arteriosclerosis 06/17/2013  . Essential hypertension 06/17/2013  . Palpitations 06/17/2013    History reviewed. No pertinent surgical history.     Home Medications    Prior to Admission medications   Medication Sig Start Date End Date Taking? Authorizing Provider  ondansetron (ZOFRAN-ODT) 4 MG disintegrating tablet Take 1 tablet (4 mg total) by mouth every 8 (eight) hours as needed for nausea or vomiting. 07/19/20  Yes Georgetta Haber, NP  ALPRAZolam (XANAX) 0.25 MG  tablet Take 0.25 mg by mouth 2 (two) times daily as needed for anxiety.    [provider]  amLODipine (NORVASC) 5 MG tablet Take 5 mg by mouth daily.    [provider]  hydrOXYzine (ATARAX/VISTARIL) 25 MG tablet Take 1 tablet (25 mg total) by mouth at bedtime as needed for anxiety. 11/06/19   Wallis Bamberg, PA-C  clonazePAM (KLONOPIN) 0.5 MG tablet Take 1 tablet (0.5 mg total) by mouth 2 (two) times daily as needed for anxiety. 09/11/14 11/06/19  Linna Hoff, MD  DULOXETINE HCL PO Take by mouth.  11/06/19  [provider]  pantoprazole (PROTONIX) 40 MG tablet Take 1 tablet (40 mg total) by mouth daily. 10/29/13 11/13/19  Lars Masson, MD  PARoxetine (PAXIL-CR) 12.5 MG 24 hr tablet Take 12.5 mg by mouth 2 (two) times daily.  11/06/19  [provider]    Family History Family History  Problem Relation Age of Onset  . Heart Problems Father 43       PCI-stent     Social History Social History   Tobacco Use  . Smoking status: Former Smoker    Years: 11.00    Types: Cigarettes    Quit date: 11/19/2019    Years since quitting: 0.6  Substance Use Topics  . Alcohol use: Yes    Comment: occ  . Drug use: No     Allergies   Crestor [rosuvastatin] and Lipitor [atorvastatin]  Review of Systems Review of Systems   Physical Exam Triage Vital Signs ED Triage Vitals  Enc Vitals Group     BP 07/19/20 1152 (!) 154/105     Pulse Rate 07/19/20 1152 89     Resp 07/19/20 1152 17     Temp 07/19/20 1152 98.4 F (36.9 C)     Temp Source 07/19/20 1152 Oral     SpO2 07/19/20 1152 97 %     Weight --      Height --      Head Circumference --      Peak Flow --      Pain Score 07/19/20 1150 0     Pain Loc --      Pain Edu? --      Excl. in GC? --    No data found.  Updated Vital Signs BP (!) 154/105 (BP Location: Right Arm)   Pulse 89   Temp 98.4 F (36.9 C) (Oral)   Resp 17   SpO2 97%   Visual Acuity Right Eye Distance:   Left Eye Distance:    Bilateral Distance:    Right Eye Near:   Left Eye Near:    Bilateral Near:     Physical Exam Constitutional:      Appearance: He is well-developed.  Cardiovascular:     Rate and Rhythm: Normal rate.  Pulmonary:     Effort: Pulmonary effort is normal.  Skin:    General: Skin is warm and dry.  Neurological:     Mental Status: He is alert and oriented to person, place, and time.      UC Treatments / Results  Labs (all labs ordered are listed, but only abnormal results are displayed) Labs Reviewed - No data to display  EKG   Radiology No results found.  Procedures Procedures (including critical care time)  Medications Ordered in UC Medications  ondansetron (ZOFRAN-ODT) disintegrating tablet 4 mg (has no administration in time range)  ibuprofen (ADVIL) tablet 800 mg (800 mg Oral Given 07/19/20 1229)    Initial Impression / Assessment and Plan / UC Course  I have reviewed the triage vital signs and the nursing notes.  Pertinent labs & imaging results that were available during my care of the patient were reviewed by me and considered in my medical decision making (see chart for details).     Recent binge alcohol intake, of scotch. Followed by headache, weakness, dizziness, nausea and vomiting. Symptoms have improved some, zofran has helped, and has eaten today. Discussed labs obtained in the ER with patient and reassurance provided. Supportive cares recommended, including to take his prescribed htn meds. Return precautions provided. Patient verbalized understanding and agreeable to plan.   Final Clinical Impressions(s) / UC Diagnoses   Final diagnoses:  Non-intractable vomiting with nausea, unspecified vomiting type  Episode of binge consumption of alcohol  Dizziness     Discharge Instructions     I have reviewed your labs and they look well given the circumstances of recent alcohol intake and vomiting.  I would expect continued gradual improvement as you  rehydrate.  Gatorade or Pedialyte can be helpful.  Zofran every 8 hours as needed for nausea or vomiting.   Ibuprofen or tylenol as needed for headache.  Ok to take your home medications.  Return for any other concerns.    ED Prescriptions    Medication Sig Dispense Auth. Provider   ondansetron (ZOFRAN-ODT) 4 MG disintegrating tablet Take 1 tablet (4 mg total)  by mouth every 8 (eight) hours as needed for nausea or vomiting. 12 tablet Georgetta Haber, NP     PDMP not reviewed this encounter.   Georgetta Haber, NP 07/19/20 1236

## 2020-07-19 NOTE — ED Notes (Signed)
Pt leaving AMA, advised to return if symptoms worsen. 

## 2020-07-19 NOTE — ED Triage Notes (Signed)
Pt presents with dizziness, headache, and vomiting. States drank 600 ml of scotch on Friday night into Saturday morning. States woke up Saturday morning very tired and weak. States "just wants to be checked out"   Went to ED yesterday but left due to wait time.

## 2020-07-19 NOTE — Discharge Instructions (Signed)
I have reviewed your labs and they look well given the circumstances of recent alcohol intake and vomiting.  I would expect continued gradual improvement as you rehydrate.  Gatorade or Pedialyte can be helpful.  Zofran every 8 hours as needed for nausea or vomiting.   Ibuprofen or tylenol as needed for headache.  Ok to take your home medications.  Return for any other concerns.
